# Patient Record
Sex: Female | Born: 1995
Health system: Southern US, Community
[De-identification: ages and names within clinical notes are randomized; demographics above are authoritative.]

## PROBLEM LIST (undated history)

## (undated) DIAGNOSIS — N159 Renal tubulo-interstitial disease, unspecified: Secondary | ICD-10-CM

## (undated) HISTORY — PX: TOOTH EXTRACTION: SUR596

---
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
## 2014-11-21 ENCOUNTER — Emergency Department (HOSPITAL_COMMUNITY)
Admission: EM | Admit: 2014-11-21 | Discharge: 2014-11-21 | Disposition: A | Discharge status: Home / Self Care | Payer: Federal, State, Local not specified - PPO | Attending: Emergency Medicine | Admitting: Emergency Medicine

## 2014-11-21 ENCOUNTER — Emergency Department (HOSPITAL_COMMUNITY): Payer: Federal, State, Local not specified - PPO

## 2014-11-21 ENCOUNTER — Encounter (HOSPITAL_COMMUNITY): Payer: Self-pay

## 2014-11-21 DIAGNOSIS — N12 Tubulo-interstitial nephritis, not specified as acute or chronic: Secondary | ICD-10-CM | POA: Insufficient documentation

## 2014-11-21 DIAGNOSIS — Z79899 Other long term (current) drug therapy: Secondary | ICD-10-CM | POA: Diagnosis not present

## 2014-11-21 DIAGNOSIS — R1031 Right lower quadrant pain: Secondary | ICD-10-CM | POA: Diagnosis present

## 2014-11-21 DIAGNOSIS — Z3202 Encounter for pregnancy test, result negative: Secondary | ICD-10-CM | POA: Diagnosis not present

## 2014-11-21 LAB — BASIC METABOLIC PANEL
ANION GAP: 6 (ref 5–15)
BUN: 12 mg/dL (ref 6–20)
CHLORIDE: 103 mmol/L (ref 101–111)
CO2: 23 mmol/L (ref 22–32)
CREATININE: 0.97 mg/dL (ref 0.44–1.00)
Calcium: 7.4 mg/dL — ABNORMAL LOW (ref 8.9–10.3)
GFR calc non Af Amer: >60 mL/min (ref >60)
Glucose, Bld: 136 mg/dL — ABNORMAL HIGH (ref 65–99)
Potassium: 3.3 mmol/L — ABNORMAL LOW (ref 3.5–5.1)
Sodium: 132 mmol/L — ABNORMAL LOW (ref 135–145)

## 2014-11-21 LAB — URINALYSIS, ROUTINE W REFLEX MICROSCOPIC
Bilirubin Urine: NEGATIVE
Glucose, UA: 100 mg/dL — AB
NITRITE: POSITIVE — AB
Protein, ur: 100 mg/dL — AB
Urobilinogen, UA: 1 mg/dL (ref 0.0–1.0)
pH: 5.5 (ref 5.0–8.0)

## 2014-11-21 LAB — CBC WITH DIFFERENTIAL/PLATELET
BASOS PCT: 0 %
Basophils Absolute: 0 10*3/uL (ref 0.0–0.1)
Eosinophils Absolute: 0 10*3/uL (ref 0.0–0.7)
Eosinophils Relative: 0 %
HEMATOCRIT: 34.7 % — AB (ref 36.0–46.0)
HEMOGLOBIN: 12.1 g/dL (ref 12.0–15.0)
LYMPHS ABS: 0.5 10*3/uL — AB (ref 0.7–4.0)
Lymphocytes Relative: 4 %
MCH: 31.8 pg (ref 26.0–34.0)
MCHC: 34.9 g/dL (ref 30.0–36.0)
MCV: 91.3 fL (ref 78.0–100.0)
Monocytes Absolute: 1 10*3/uL (ref 0.1–1.0)
Monocytes Relative: 7 %
NEUTROS PCT: 89 %
Neutro Abs: 12.4 10*3/uL — ABNORMAL HIGH (ref 1.7–7.7)
Platelets: 147 10*3/uL — ABNORMAL LOW (ref 150–400)
RBC: 3.8 MIL/uL — ABNORMAL LOW (ref 3.87–5.11)
RDW: 12.4 % (ref 11.5–15.5)
WBC: 14 10*3/uL — ABNORMAL HIGH (ref 4.0–10.5)

## 2014-11-21 LAB — URINE MICROSCOPIC-ADD ON

## 2014-11-21 LAB — POC URINE PREG, ED: Preg Test, Ur: NEGATIVE

## 2014-11-21 MED ORDER — SODIUM CHLORIDE 0.9 % IV BOLUS (SEPSIS)
1000.0000 mL | Freq: Once | INTRAVENOUS | Status: AC
  Administered 2014-11-21: 1000 mL via INTRAVENOUS

## 2014-11-21 MED ORDER — ONDANSETRON HCL 4 MG/2ML IJ SOLN
4.0000 mg | Freq: Once | INTRAMUSCULAR | Status: AC
  Administered 2014-11-21: 4 mg via INTRAVENOUS

## 2014-11-21 MED ORDER — IOHEXOL 300 MG/ML  SOLN
100.0000 mL | Freq: Once | INTRAMUSCULAR | Status: AC | PRN
  Administered 2014-11-21: 100 mL via INTRAVENOUS

## 2014-11-21 MED ORDER — HYDROCODONE-ACETAMINOPHEN 5-325 MG PO TABS
2.0000 | ORAL_TABLET | ORAL | Status: DC | PRN
Start: 2014-11-21 — End: 2015-09-02

## 2014-11-21 MED ORDER — MORPHINE SULFATE (PF) 2 MG/ML IV SOLN
2.0000 mg | Freq: Once | INTRAVENOUS | Status: AC
  Administered 2014-11-21: 2 mg via INTRAVENOUS
  Filled 2014-11-21: qty 1

## 2014-11-21 MED ORDER — MORPHINE SULFATE (PF) 4 MG/ML IV SOLN: 4.0000 mg | Freq: Once | INTRAVENOUS | Status: DC

## 2014-11-21 MED ORDER — ONDANSETRON HCL 4 MG/2ML IJ SOLN
INTRAMUSCULAR | Status: AC
  Filled 2014-11-21: qty 2

## 2014-11-21 MED ORDER — ACETAMINOPHEN 500 MG PO TABS
1000.0000 mg | ORAL_TABLET | Freq: Once | ORAL | Status: AC
  Administered 2014-11-21: 1000 mg via ORAL
  Filled 2014-11-21: qty 2

## 2014-11-21 MED ORDER — FENTANYL CITRATE (PF) 100 MCG/2ML IJ SOLN
INTRAMUSCULAR | Status: AC
  Administered 2014-11-21: 100 ug via INTRAVENOUS
  Filled 2014-11-21: qty 2

## 2014-11-21 MED ORDER — ACETAMINOPHEN 500 MG PO TABS: 1000.0000 mg | ORAL_TABLET | Freq: Once | ORAL | Status: DC

## 2014-11-21 MED ORDER — SODIUM CHLORIDE 0.9 % IJ SOLN
INTRAMUSCULAR | Status: AC
  Filled 2014-11-21: qty 1000

## 2014-11-21 MED ORDER — FENTANYL CITRATE (PF) 100 MCG/2ML IJ SOLN
100.0000 ug | Freq: Once | INTRAMUSCULAR | Status: AC
  Administered 2014-11-21: 100 ug via INTRAVENOUS

## 2014-11-21 MED ORDER — NAPROXEN 500 MG PO TABS: 500.0000 mg | ORAL_TABLET | Freq: Two times a day (BID) | ORAL | Status: DC

## 2014-11-21 MED ORDER — CEPHALEXIN 500 MG PO CAPS: 500.0000 mg | ORAL_CAPSULE | Freq: Four times a day (QID) | ORAL | Status: DC

## 2014-11-21 MED ORDER — SODIUM CHLORIDE 0.9 % IJ SOLN
INTRAMUSCULAR | Status: AC
  Filled 2014-11-21: qty 30

## 2014-11-21 MED ORDER — DEXTROSE 5 % IV SOLN
1.0000 g | Freq: Once | INTRAVENOUS | Status: AC
  Administered 2014-11-21: 1 g via INTRAVENOUS
  Filled 2014-11-21: qty 10

## 2014-11-21 MED ORDER — ONDANSETRON HCL 4 MG PO TABS: 4.0000 mg | ORAL_TABLET | Freq: Three times a day (TID) | ORAL | Status: DC | PRN
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
## 2014-11-21 NOTE — Discharge Instructions (Signed)

## 2014-11-21 NOTE — ED Notes (Signed)
Lab at bedside to redraw bmet,

## 2014-11-21 NOTE — ED Notes (Signed)
Pt requesting additional pain medication while playing on her phone, mother at bedside, pt also c/o pain at iv site, states that she hit the site while she was in the restroom, iv site wnl, able to be flushed with no problems, blood return noted,

## 2014-11-21 NOTE — ED Notes (Signed)
Pt c/o RLQ abd pain since Monday.  Reports vomiting since yesterday.  Pt saw pcp yesterday and was given an antibiotic for a UTI.  LBM was yesterday and was normal.

## 2014-11-21 NOTE — ED Notes (Signed)
Dr. Hyacinth MeekerMiller notified of 100.9 temp

## 2014-11-21 NOTE — ED Notes (Signed)
Update given to family at bedside,

## 2014-11-21 NOTE — ED Notes (Signed)
Pt would not tolerate bp cuff in triage.

## 2014-11-22 ENCOUNTER — Encounter: Payer: Self-pay | Admitting: Adult Health

## 2014-11-23 LAB — URINE CULTURE: Culture: NO GROWTH

## 2015-04-04 ENCOUNTER — Encounter (HOSPITAL_COMMUNITY): Payer: Self-pay | Admitting: Emergency Medicine

## 2015-04-04 ENCOUNTER — Emergency Department (HOSPITAL_COMMUNITY)
Admission: EM | Admit: 2015-04-04 | Discharge: 2015-04-04 | Disposition: A | Discharge status: Home / Self Care | Payer: Federal, State, Local not specified - PPO | Attending: Emergency Medicine | Admitting: Emergency Medicine

## 2015-04-04 DIAGNOSIS — Z792 Long term (current) use of antibiotics: Secondary | ICD-10-CM | POA: Insufficient documentation

## 2015-04-04 DIAGNOSIS — R42 Dizziness and giddiness: Secondary | ICD-10-CM | POA: Diagnosis not present

## 2015-04-04 DIAGNOSIS — R51 Headache: Secondary | ICD-10-CM | POA: Insufficient documentation

## 2015-04-04 DIAGNOSIS — Z791 Long term (current) use of non-steroidal anti-inflammatories (NSAID): Secondary | ICD-10-CM | POA: Diagnosis not present

## 2015-04-04 DIAGNOSIS — Z79899 Other long term (current) drug therapy: Secondary | ICD-10-CM | POA: Diagnosis not present

## 2015-04-04 DIAGNOSIS — N12 Tubulo-interstitial nephritis, not specified as acute or chronic: Secondary | ICD-10-CM

## 2015-04-04 DIAGNOSIS — R1030 Lower abdominal pain, unspecified: Secondary | ICD-10-CM | POA: Diagnosis present

## 2015-04-04 HISTORY — DX: Renal tubulo-interstitial disease, unspecified: N15.9

## 2015-04-04 LAB — BASIC METABOLIC PANEL
Anion gap: 9 (ref 5–15)
BUN: 10 mg/dL (ref 6–20)
CO2: 25 mmol/L (ref 22–32)
Calcium: 9.2 mg/dL (ref 8.9–10.3)
Chloride: 104 mmol/L (ref 101–111)
Creatinine, Ser: 0.86 mg/dL (ref 0.44–1.00)
GFR calc Af Amer: >60 mL/min (ref >60)
Glucose, Bld: 93 mg/dL (ref 65–99)
Potassium: 3.6 mmol/L (ref 3.5–5.1)
SODIUM: 138 mmol/L (ref 135–145)

## 2015-04-04 LAB — URINALYSIS, ROUTINE W REFLEX MICROSCOPIC
Bilirubin Urine: NEGATIVE
Glucose, UA: NEGATIVE mg/dL
KETONES UR: NEGATIVE mg/dL
NITRITE: POSITIVE — AB
PH: 7.5 (ref 5.0–8.0)
Protein, ur: 30 mg/dL — AB
Specific Gravity, Urine: 1.01 (ref 1.005–1.030)

## 2015-04-04 LAB — CBC WITH DIFFERENTIAL/PLATELET
Basophils Absolute: 0 10*3/uL (ref 0.0–0.1)
Basophils Relative: 0 %
EOS ABS: 0 10*3/uL (ref 0.0–0.7)
EOS PCT: 0 %
HCT: 36.5 % (ref 36.0–46.0)
Hemoglobin: 12.7 g/dL (ref 12.0–15.0)
LYMPHS ABS: 1.1 10*3/uL (ref 0.7–4.0)
Lymphocytes Relative: 9 %
MCH: 31.4 pg (ref 26.0–34.0)
MCHC: 34.8 g/dL (ref 30.0–36.0)
MCV: 90.1 fL (ref 78.0–100.0)
MONO ABS: 1.1 10*3/uL — AB (ref 0.1–1.0)
MONOS PCT: 9 %
Neutro Abs: 9.5 10*3/uL — ABNORMAL HIGH (ref 1.7–7.7)
Neutrophils Relative %: 82 %
PLATELETS: 206 10*3/uL (ref 150–400)
RBC: 4.05 MIL/uL (ref 3.87–5.11)
RDW: 12.1 % (ref 11.5–15.5)
WBC: 11.7 10*3/uL — AB (ref 4.0–10.5)

## 2015-04-04 LAB — URINE MICROSCOPIC-ADD ON

## 2015-04-04 LAB — PREGNANCY, URINE: Preg Test, Ur: NEGATIVE

## 2015-04-04 MED ORDER — ONDANSETRON HCL 4 MG/2ML IJ SOLN
4.0000 mg | Freq: Once | INTRAMUSCULAR | Status: AC
  Administered 2015-04-04: 4 mg via INTRAVENOUS
  Filled 2015-04-04: qty 2

## 2015-04-04 MED ORDER — CIPROFLOXACIN IN D5W 400 MG/200ML IV SOLN
400.0000 mg | Freq: Once | INTRAVENOUS | Status: AC
  Administered 2015-04-04: 400 mg via INTRAVENOUS
  Filled 2015-04-04: qty 200

## 2015-04-04 MED ORDER — SODIUM CHLORIDE 0.9 % IV BOLUS (SEPSIS)
250.0000 mL | Freq: Once | INTRAVENOUS | Status: AC
  Administered 2015-04-04: 250 mL via INTRAVENOUS

## 2015-04-04 MED ORDER — CIPROFLOXACIN HCL 500 MG PO TABS: 500.0000 mg | ORAL_TABLET | Freq: Two times a day (BID) | ORAL | Status: DC

## 2015-04-04 MED ORDER — SODIUM CHLORIDE 0.9 % IV BOLUS (SEPSIS)
750.0000 mL | Freq: Once | INTRAVENOUS | Status: AC
  Administered 2015-04-04: 750 mL via INTRAVENOUS

## 2015-04-04 MED ORDER — HYDROCODONE-ACETAMINOPHEN 5-325 MG PO TABS: 1.0000 | ORAL_TABLET | Freq: Four times a day (QID) | ORAL | Status: DC | PRN

## 2015-04-04 MED ORDER — PHENAZOPYRIDINE HCL 100 MG PO TABS
ORAL_TABLET | ORAL | Status: AC
  Filled 2015-04-04: qty 2

## 2015-04-04 MED ORDER — PHENAZOPYRIDINE HCL 100 MG PO TABS
200.0000 mg | ORAL_TABLET | Freq: Once | ORAL | Status: AC
  Administered 2015-04-04: 200 mg via ORAL

## 2015-04-04 MED ORDER — ONDANSETRON 4 MG PO TBDP: 4.0000 mg | ORAL_TABLET | Freq: Three times a day (TID) | ORAL | Status: DC | PRN

## 2015-04-04 MED ORDER — HYDROMORPHONE HCL 1 MG/ML IJ SOLN
1.0000 mg | Freq: Once | INTRAMUSCULAR | Status: AC
  Administered 2015-04-04: 1 mg via INTRAVENOUS
  Filled 2015-04-04: qty 1

## 2015-04-04 MED ORDER — SODIUM CHLORIDE 0.9 % IV SOLN
INTRAVENOUS | Status: DC
  Administered 2015-04-04: 12:00:00 via INTRAVENOUS

## 2015-04-04 NOTE — ED Provider Notes (Addendum)
CSN: [id number]     Arrival date & time 04/04/15  1003 History  By signing my name below, I, Tanda Rockers, attest that this documentation has been prepared under the direction and in the presence of Vanetta Mulders, MD. Electronically Signed: Tanda Rockers, ED Scribe. 04/04/2015. 11:43 AM.   Chief Complaint  Patient presents with  . Abdominal Pain   The history is provided by the patient. No language interpreter was used.     HPI Comments: Leslie Simon is a 20 y.o. female who presents to the Emergency Department complaining of constant foul odor to urine x 2 weeks. Pt also complains of suprapubic abdominal pain radiating to back, nausea, and vomiting. She mentions having a fever a few days ago that has since resolved on its own. Pt had a UTI in November 2016 but never finished the antibiotics. She had left over Keflex and began taking it about 1 week ago with no relief. Denies any other associated symptoms.   Past Medical History  Diagnosis Date  . Kidney infection    Past Surgical History  Procedure Laterality Date  . Tooth extraction     Family History  Problem Relation Age of Onset  . Heart disease    . Cancer    . Diabetes     Social History  Substance Use Topics  . Smoking status: Never Smoker   . Smokeless tobacco: None  . Alcohol Use: No   OB History    No data available     Review of Systems  Constitutional: Positive for fever and chills.  HENT: Negative for congestion, rhinorrhea and sore throat.   Eyes: Negative for visual disturbance.  Respiratory: Negative for cough and shortness of breath.   Cardiovascular: Negative for chest pain and leg swelling.  Gastrointestinal: Positive for nausea, vomiting and abdominal pain. Negative for diarrhea.  Genitourinary: Negative for dysuria, frequency and hematuria.       + Foul smelling urine  Musculoskeletal: Positive for back pain. Negative for neck pain.  Skin: Negative for rash.  Neurological: Positive for  dizziness, light-headedness and headaches.  Hematological: Does not bruise/bleed easily.  Psychiatric/Behavioral: Negative for confusion.      Allergies  Review of patient's allergies indicates no known allergies.  Home Medications   Prior to Admission medications   Medication Sig Start Date End Date Taking? Authorizing Provider  cephALEXin (KEFLEX) 500 MG capsule Take 1 capsule (500 mg total) by mouth 4 (four) times daily. Patient taking differently: Take 500 mg by mouth 2 (two) times daily.  11/21/14  Yes Eber Hong, MD  ibuprofen (ADVIL,MOTRIN) 200 MG tablet Take 600 mg by mouth daily as needed for moderate pain.   Yes Historical Provider, MD  ondansetron (ZOFRAN) 4 MG tablet Take 1 tablet (4 mg total) by mouth every 8 (eight) hours as needed for nausea or vomiting. 11/21/14  Yes Eber Hong, MD  ciprofloxacin (CIPRO) 500 MG tablet Take 1 tablet (500 mg total) by mouth 2 (two) times daily. 04/04/15   Vanetta Mulders, MD  HYDROcodone-acetaminophen (NORCO/VICODIN) 5-325 MG tablet Take 2 tablets by mouth every 4 (four) hours as needed. Patient not taking: Reported on 04/04/2015 11/21/14   Eber Hong, MD  HYDROcodone-acetaminophen (NORCO/VICODIN) 5-325 MG tablet Take 1-2 tablets by mouth every 6 (six) hours as needed. 04/04/15   Vanetta Mulders, MD  ondansetron (ZOFRAN ODT) 4 MG disintegrating tablet Take 1 tablet (4 mg total) by mouth every 8 (eight) hours as needed. 04/04/15   Vanetta Mulders, MD  BP 104/63 mmHg  Pulse 71  Temp(Src) 98.5 F (36.9 C) (Oral)  Resp 18  Ht 5\' 8"  (1.727 m)  Wt 61.236 kg  BMI 20.53 kg/m2  SpO2 97%  LMP 03/18/2015   Physical Exam  Constitutional: She is oriented to person, place, and time. She appears well-developed and well-nourished. No distress.  HENT:  Head: Normocephalic and atraumatic.  Mouth/Throat: Mucous membranes are normal.  Eyes: Conjunctivae and EOM are normal. Pupils are equal, round, and reactive to light. Right eye exhibits no  discharge. Left eye exhibits no discharge.  Neck: Neck supple. No tracheal deviation present.  Cardiovascular: Normal rate, regular rhythm and normal heart sounds.   Pulmonary/Chest: Effort normal and breath sounds normal. No respiratory distress. She has no wheezes. She has no rhonchi. She has no rales.  Abdominal: There is tenderness in the suprapubic area.  Musculoskeletal: Normal range of motion. She exhibits no edema.  Neurological: She is alert and oriented to person, place, and time.  Skin: Skin is warm and dry.  Psychiatric: She has a normal mood and affect. Her behavior is normal.  Nursing note and vitals reviewed.   ED Course  Procedures (including critical care time)  DIAGNOSTIC STUDIES: Oxygen Saturation is 94% on RA, adequate by my interpretation.    COORDINATION OF CARE: 11:28 AM-Discussed treatment plan with pt at bedside and pt agreed to plan.   Labs Review Labs Reviewed  URINALYSIS, ROUTINE W REFLEX MICROSCOPIC (NOT AT [facility]) - Abnormal; Notable for the following:    APPearance HAZY (*)    Hgb urine dipstick TRACE (*)    Protein, ur 30 (*)    Nitrite POSITIVE (*)    Leukocytes, UA SMALL (*)    All other components within normal limits  URINE MICROSCOPIC-ADD ON - Abnormal; Notable for the following:    Squamous Epithelial / LPF TOO NUMEROUS TO COUNT (*)    Bacteria, UA MANY (*)    All other components within normal limits  CBC WITH DIFFERENTIAL/PLATELET - Abnormal; Notable for the following:    WBC 11.7 (*)    Neutro Abs 9.5 (*)    Monocytes Absolute 1.1 (*)    All other components within normal limits  URINE CULTURE  PREGNANCY, URINE  BASIC METABOLIC PANEL   Results for orders placed or performed during the hospital encounter of 04/04/15  Urinalysis, Routine w reflex microscopic (not at [facility])  Result Value Ref Range   Color, Urine YELLOW YELLOW   APPearance HAZY (A) CLEAR   Specific Gravity, Urine 1.010 1.005 - 1.030   pH 7.5 5.0 - 8.0   Glucose, UA  NEGATIVE NEGATIVE mg/dL   Hgb urine dipstick TRACE (A) NEGATIVE   Bilirubin Urine NEGATIVE NEGATIVE   Ketones, ur NEGATIVE NEGATIVE mg/dL   Protein, ur 30 (A) NEGATIVE mg/dL   Nitrite POSITIVE (A) NEGATIVE   Leukocytes, UA SMALL (A) NEGATIVE  Pregnancy, urine  Result Value Ref Range   Preg Test, Ur NEGATIVE NEGATIVE  Urine microscopic-add on  Result Value Ref Range   Squamous Epithelial / LPF TOO NUMEROUS TO COUNT (A) NONE SEEN   WBC, UA TOO NUMEROUS TO COUNT 0 - 5 WBC/hpf   RBC / HPF 0-5 0 - 5 RBC/hpf   Bacteria, UA MANY (A) NONE SEEN  CBC with Differential/Platelet  Result Value Ref Range   WBC 11.7 (H) 4.0 - 10.5 K/uL   RBC 4.05 3.87 - 5.11 MIL/uL   Hemoglobin 12.7 12.0 - 15.0 g/dL   HCT 16.136.5 09.636.0 -  46.0 %   MCV 90.1 78.0 - 100.0 fL   MCH 31.4 26.0 - 34.0 pg   MCHC 34.8 30.0 - 36.0 g/dL   RDW 16.1 09.6 - 04.5 %   Platelets 206 150 - 400 K/uL   Neutrophils Relative % 82 %   Neutro Abs 9.5 (H) 1.7 - 7.7 K/uL   Lymphocytes Relative 9 %   Lymphs Abs 1.1 0.7 - 4.0 K/uL   Monocytes Relative 9 %   Monocytes Absolute 1.1 (H) 0.1 - 1.0 K/uL   Eosinophils Relative 0 %   Eosinophils Absolute 0.0 0.0 - 0.7 K/uL   Basophils Relative 0 %   Basophils Absolute 0.0 0.0 - 0.1 K/uL  Basic metabolic panel  Result Value Ref Range   Sodium 138 135 - 145 mmol/L   Potassium 3.6 3.5 - 5.1 mmol/L   Chloride 104 101 - 111 mmol/L   CO2 25 22 - 32 mmol/L   Glucose, Bld 93 65 - 99 mg/dL   BUN 10 6 - 20 mg/dL   Creatinine, Ser 4.09 0.44 - 1.00 mg/dL   Calcium 9.2 8.9 - 81.1 mg/dL   GFR calc non Af Amer >60 >60 mL/min   GFR calc Af Amer >60 >60 mL/min   Anion gap 9 5 - 15     Imaging Review No results found. I have personally reviewed and evaluated these lab results as part of my medical decision-making.   EKG Interpretation None      MDM   Final diagnoses:  Pyelonephritis   Patient with 2 week history of urinary tract symptoms. Took Keflex that she had left over for one week  without any improvement. Urinalysis totally consistent with urinary tract infection. Culture sent for confirmation. Patient's symptomatology consistent with pyelonephritis. Some nausea some vomiting and she'll feeling pain in the lower part of the back. Will treat as pyelonephritis. Patient given one dose of IV Cipro here and be continued on Cipro for the next 10 days. Patient instructed if not improved in 2 days will return. Patient without any concerns for pelvic infection no vaginal discharge. See test negative.  I personally performed the services described in this documentation, which was scribed in my presence. The recorded information has been reviewed and is accurate.        Vanetta Mulders, MD 04/04/15 1304  Vanetta Mulders, MD 04/04/15 [phone]

## 2015-04-04 NOTE — Discharge Instructions (Signed)
Pyelonephritis, Adult Pyelonephritis is a kidney infection. The kidneys are organs that help clean your blood by moving waste out of your blood and into your pee (urine). This infection can happen quickly, or it can last for a long time. In most cases, it clears up with treatment and does not cause other problems. HOME CARE Medicines  Take over-the-counter and prescription medicines only as told by your doctor.  Take your antibiotic medicine as told by your doctor. Do not stop taking the medicine even if you start to feel better. General Instructions  Drink enough fluid to keep your pee clear or pale yellow.  Avoid caffeine, tea, and carbonated drinks.  Pee (urinate) often. Avoid holding in pee for long periods of time.  Pee before and after sex.  After pooping (having a bowel movement), women should wipe from front to back. Use each tissue only once.  Keep all follow-up visits as told by your doctor. This is important. GET HELP IF:  You do not feel better after 2 days.  Your symptoms get worse.  You have a fever. GET HELP RIGHT AWAY IF:  You cannot take your medicine or drink fluids as told.  You have chills and shaking.  You throw up (vomit).  You have very bad pain in your side (flank) or back.  You feel very weak or you pass out (faint).   This information is not intended to replace advice given to you by your health care provider. Make sure you discuss any questions you have with your health care provider.  Take antibiotic as directed. Take pain medicine antinausea medicine as needed. Make appointment to follow-up with your doctor if not improving in 2 days. Return for any new or worse symptoms. Expected to improve in 2 days.   Document Released: 02/06/2004 Document Revised: 09/19/2014 Document Reviewed: 04/23/2014 Elsevier Interactive Patient Education [employer]

## 2015-04-04 NOTE — ED Notes (Signed)
Pt reports lower abd pain radiating to back. Pt reports odor to urine,urinary frequency. Pt reports intermittent nausea and fever. Pt reports taking otc medicine with no relief. nad noted.

## 2015-04-06 ENCOUNTER — Encounter (HOSPITAL_COMMUNITY): Payer: Self-pay

## 2015-04-06 ENCOUNTER — Emergency Department (HOSPITAL_COMMUNITY)
Admission: EM | Admit: 2015-04-06 | Discharge: 2015-04-06 | Disposition: A | Discharge status: Home / Self Care | Payer: Federal, State, Local not specified - PPO | Attending: Emergency Medicine | Admitting: Emergency Medicine

## 2015-04-06 ENCOUNTER — Emergency Department (HOSPITAL_COMMUNITY): Payer: Federal, State, Local not specified - PPO

## 2015-04-06 DIAGNOSIS — N12 Tubulo-interstitial nephritis, not specified as acute or chronic: Secondary | ICD-10-CM | POA: Diagnosis not present

## 2015-04-06 DIAGNOSIS — R109 Unspecified abdominal pain: Secondary | ICD-10-CM

## 2015-04-06 DIAGNOSIS — Z792 Long term (current) use of antibiotics: Secondary | ICD-10-CM | POA: Insufficient documentation

## 2015-04-06 DIAGNOSIS — Z791 Long term (current) use of non-steroidal anti-inflammatories (NSAID): Secondary | ICD-10-CM | POA: Diagnosis not present

## 2015-04-06 LAB — URINALYSIS, ROUTINE W REFLEX MICROSCOPIC
BILIRUBIN URINE: NEGATIVE
GLUCOSE, UA: NEGATIVE mg/dL
HGB URINE DIPSTICK: NEGATIVE
Ketones, ur: NEGATIVE mg/dL
Leukocytes, UA: NEGATIVE
Nitrite: NEGATIVE
Protein, ur: NEGATIVE mg/dL
SPECIFIC GRAVITY, URINE: 1.02 (ref 1.005–1.030)
pH: 6 (ref 5.0–8.0)

## 2015-04-06 LAB — URINE CULTURE: Culture: >=100000

## 2015-04-06 LAB — POC URINE PREG, ED: Preg Test, Ur: NEGATIVE

## 2015-04-06 MED ORDER — IBUPROFEN 800 MG PO TABS
800.0000 mg | ORAL_TABLET | Freq: Once | ORAL | Status: AC
  Administered 2015-04-06: 800 mg via ORAL

## 2015-04-06 MED ORDER — IBUPROFEN 800 MG PO TABS
ORAL_TABLET | ORAL | Status: AC
  Filled 2015-04-06: qty 1

## 2015-04-06 NOTE — ED Notes (Signed)
Pt given Ibuprofen 800mg  with crackers and peanut better

## 2015-04-06 NOTE — Discharge Instructions (Signed)

## 2015-04-06 NOTE — ED Notes (Signed)
Pt reports here two days ago with UTI and mild right flank pain. Prescribed antibiotics which she is taking, states all urinary symptoms have resolved but right flank pain now worse.

## 2015-04-07 ENCOUNTER — Telehealth: Payer: Self-pay | Admitting: *Deleted

## 2015-04-07 NOTE — ED Notes (Signed)
(+)  urine culture, reviewed by Ronnell FreshwaterS. Joy, PA, treated with Cipro, no changes needed

## 2015-04-08 NOTE — ED Provider Notes (Signed)
CSN: [id number]     Arrival date & time 04/06/15  1107 History   First MD Initiated Contact with Patient 04/06/15 1140     Chief Complaint  Patient presents with  . Flank Pain     (Consider location/radiation/quality/duration/timing/severity/associated sxs/prior Treatment) The history is provided by the patient.   Leslie Simon is a 20 y.o. female with a frequent history of uti's and pyelonephritis was seen here 2 days ago and diagnosed with a uti/probable right pyelonephritis and was started on ciprofloxacin which she is taking, last dose this am.  She endorses that her urinary symptoms including frequency along with foul smelling, dark urine and suprapubic tenderness has improved but now she has worsened right flank pain along with her persistent nausea, but has had no vomiting and also denies fevers or chills.  She denies personal or family history of kidney stones.  She was told to return for any worsened symptoms.      Past Medical History  Diagnosis Date  . Kidney infection    Past Surgical History  Procedure Laterality Date  . Tooth extraction     Family History  Problem Relation Age of Onset  . Heart disease    . Cancer    . Diabetes     Social History  Substance Use Topics  . Smoking status: Never Smoker   . Smokeless tobacco: None  . Alcohol Use: No   OB History    No data available     Review of Systems  Constitutional: Negative for fever.  HENT: Negative for congestion and sore throat.   Eyes: Negative.   Respiratory: Negative for chest tightness and shortness of breath.   Cardiovascular: Negative for chest pain.  Gastrointestinal: Negative for nausea, vomiting and abdominal pain.  Genitourinary: Positive for frequency. Negative for hematuria, vaginal discharge, vaginal pain and pelvic pain.       Negative except as mentioned in HPI.    Musculoskeletal: Negative for joint swelling, arthralgias and neck pain.  Skin: Negative.  Negative for rash and  wound.  Neurological: Negative for dizziness, weakness, light-headedness, numbness and headaches.  Psychiatric/Behavioral: Negative.       Allergies  Review of patient's allergies indicates no known allergies.  Home Medications   Prior to Admission medications   Medication Sig Start Date End Date Taking? Authorizing Provider  ciprofloxacin (CIPRO) 500 MG tablet Take 1 tablet (500 mg total) by mouth 2 (two) times daily. 04/04/15  Yes Vanetta Mulders, MD  HYDROcodone-acetaminophen (NORCO/VICODIN) 5-325 MG tablet Take 1-2 tablets by mouth every 6 (six) hours as needed. 04/04/15  Yes Vanetta Mulders, MD  ibuprofen (ADVIL,MOTRIN) 200 MG tablet Take 600 mg by mouth daily as needed for moderate pain.   Yes Historical Provider, MD  ondansetron (ZOFRAN ODT) 4 MG disintegrating tablet Take 1 tablet (4 mg total) by mouth every 8 (eight) hours as needed. Patient taking differently: Take 4 mg by mouth every 8 (eight) hours as needed for nausea or vomiting.  04/04/15  Yes Vanetta Mulders, MD  cephALEXin (KEFLEX) 500 MG capsule Take 1 capsule (500 mg total) by mouth 4 (four) times daily. Patient not taking: Reported on 04/06/2015 11/21/14   Eber Hong, MD  HYDROcodone-acetaminophen (NORCO/VICODIN) 5-325 MG tablet Take 2 tablets by mouth every 4 (four) hours as needed. Patient not taking: Reported on 04/04/2015 11/21/14   Eber Hong, MD  ondansetron (ZOFRAN) 4 MG tablet Take 1 tablet (4 mg total) by mouth every 8 (eight) hours as needed for nausea or  vomiting. Patient not taking: Reported on 04/06/2015 11/21/14   Eber HongBrian Miller, MD   BP 102/60 mmHg  Pulse 75  Temp(Src) 97.9 F (36.6 C) (Oral)  Resp 16  Ht 5\' 8"  (1.727 m)  Wt 61.236 kg  BMI 20.53 kg/m2  SpO2 100%  LMP 03/18/2015 Physical Exam  Constitutional: She appears well-developed and well-nourished.  HENT:  Head: Normocephalic and atraumatic.  Eyes: Conjunctivae are normal.  Neck: Normal range of motion.  Cardiovascular: Normal rate, regular  rhythm, normal heart sounds and intact distal pulses.   Pulmonary/Chest: Effort normal and breath sounds normal. She has no wheezes.  Abdominal: Soft. Bowel sounds are normal. There is no tenderness. There is CVA tenderness. There is no rebound and no guarding.  Right cva ttp.  Musculoskeletal: Normal range of motion.  Neurological: She is alert.  Skin: Skin is warm and dry.  Psychiatric: She has a normal mood and affect.  Nursing note and vitals reviewed.   ED Course  Procedures (including critical care time) Labs Review Labs Reviewed  URINALYSIS, ROUTINE W REFLEX MICROSCOPIC (NOT AT [facility])  POC URINE PREG, ED    Imaging Review No results found. I have personally reviewed and evaluated these images and lab results as part of my medical decision-making.   EKG Interpretation None      MDM   Final diagnoses:  Flank pain  Pyelonephritis    Labs reviewed, urine improved from last.  Culture reviewed, infection sensitive to cipro.  CT reviewed, mild pyelo, no renal stone, no sign of abscess.  Pt encouraged to continue current regimen, finish entire course of abx, increased fluid intake, f/u with pcp or return here for any worsened sx .    Burgess AmorJulie Giabella Duhart, PA-C 04/08/15 1647  Pricilla LovelessScott Goldston, MD 04/11/15 1447

## 2015-09-02 ENCOUNTER — Encounter (HOSPITAL_COMMUNITY): Payer: Self-pay | Admitting: Emergency Medicine

## 2015-09-02 ENCOUNTER — Emergency Department (HOSPITAL_COMMUNITY)
Admission: EM | Admit: 2015-09-02 | Discharge: 2015-09-02 | Disposition: A | Discharge status: Home / Self Care | Payer: Federal, State, Local not specified - PPO | Attending: Emergency Medicine | Admitting: Emergency Medicine

## 2015-09-02 DIAGNOSIS — N760 Acute vaginitis: Secondary | ICD-10-CM | POA: Diagnosis not present

## 2015-09-02 DIAGNOSIS — R1031 Right lower quadrant pain: Secondary | ICD-10-CM | POA: Insufficient documentation

## 2015-09-02 DIAGNOSIS — B9689 Other specified bacterial agents as the cause of diseases classified elsewhere: Secondary | ICD-10-CM

## 2015-09-02 DIAGNOSIS — Z79899 Other long term (current) drug therapy: Secondary | ICD-10-CM | POA: Diagnosis not present

## 2015-09-02 LAB — URINE MICROSCOPIC-ADD ON

## 2015-09-02 LAB — CBC
HCT: 36.8 % (ref 36.0–46.0)
Hemoglobin: 12.4 g/dL (ref 12.0–15.0)
MCH: 30.3 pg (ref 26.0–34.0)
MCHC: 33.7 g/dL (ref 30.0–36.0)
MCV: 90 fL (ref 78.0–100.0)
Platelets: 168 10*3/uL (ref 150–400)
RBC: 4.09 MIL/uL (ref 3.87–5.11)
RDW: 12.4 % (ref 11.5–15.5)
WBC: 10.5 10*3/uL (ref 4.0–10.5)

## 2015-09-02 LAB — BASIC METABOLIC PANEL
Anion gap: 9 (ref 5–15)
BUN: 12 mg/dL (ref 6–20)
CO2: 24 mmol/L (ref 22–32)
Calcium: 8.8 mg/dL — ABNORMAL LOW (ref 8.9–10.3)
Chloride: 103 mmol/L (ref 101–111)
Creatinine, Ser: 0.72 mg/dL (ref 0.44–1.00)
GFR calc Af Amer: >60 mL/min (ref >60)
GFR calc non Af Amer: >60 mL/min (ref >60)
Glucose, Bld: 80 mg/dL (ref 65–99)
Potassium: 3.9 mmol/L (ref 3.5–5.1)
Sodium: 136 mmol/L (ref 135–145)

## 2015-09-02 LAB — URINALYSIS, ROUTINE W REFLEX MICROSCOPIC
Bilirubin Urine: NEGATIVE
Glucose, UA: NEGATIVE mg/dL
Hgb urine dipstick: NEGATIVE
Ketones, ur: NEGATIVE mg/dL
Nitrite: NEGATIVE
Protein, ur: NEGATIVE mg/dL
Specific Gravity, Urine: 1.025 (ref 1.005–1.030)
pH: 6 (ref 5.0–8.0)

## 2015-09-02 LAB — WET PREP, GENITAL
Sperm: NONE SEEN
Trich, Wet Prep: NONE SEEN
Yeast Wet Prep HPF POC: NONE SEEN

## 2015-09-02 LAB — PREGNANCY, URINE: Preg Test, Ur: NEGATIVE

## 2015-09-02 MED ORDER — IBUPROFEN 400 MG PO TABS
ORAL_TABLET | ORAL | Status: AC
  Filled 2015-09-02: qty 1

## 2015-09-02 MED ORDER — OXYCODONE HCL 5 MG PO TABS
ORAL_TABLET | ORAL | Status: AC
  Filled 2015-09-02: qty 1

## 2015-09-02 MED ORDER — OXYCODONE-ACETAMINOPHEN 5-325 MG PO TABS
1.0000 | ORAL_TABLET | Freq: Once | ORAL | Status: DC
  Filled 2015-09-02: qty 1

## 2015-09-02 MED ORDER — IBUPROFEN 400 MG PO TABS
400.0000 mg | ORAL_TABLET | Freq: Once | ORAL | Status: AC
  Administered 2015-09-02: 400 mg via ORAL
  Filled 2015-09-02: qty 1

## 2015-09-02 MED ORDER — CEPHALEXIN 500 MG PO CAPS: 500.0000 mg | ORAL_CAPSULE | Freq: Three times a day (TID) | ORAL | 0 refills | Status: DC

## 2015-09-02 MED ORDER — METRONIDAZOLE 500 MG PO TABS
2000.0000 mg | ORAL_TABLET | Freq: Once | ORAL | Status: AC
  Administered 2015-09-02: 2000 mg via ORAL
  Filled 2015-09-02: qty 4

## 2015-09-02 NOTE — ED Triage Notes (Addendum)
Patient complaining of right flank pain x 2 days. Also complaining of urinary frequency.

## 2015-09-02 NOTE — ED Provider Notes (Signed)
[facility]-EMER[facility]ENCY DEPT Provider Note   CSN: [id number] Arrival date & time: 09/02/15  1454     History   Chief Complaint Chief Complaint  Patient presents with  . Flank Pain    HPI Leslie Simon is a 20 y.o. female.  HPI   20yF with R abdominal/R flank pain. Onset yesterday. Constant/progressive since then. No appreciable exacerbating or relieving factors. Feels achy. Reminds her of prior kidney infections. Increased urinary frequency. No urinary complaints otherwise. Some vaginal discharge. No bleeding. LMP about 3w ago. Doesn't think she is pregnant. Is sexually active. Subjective fever. Body aches. No n/v.   Past Medical History:  Diagnosis Date  . Kidney infection     Patient Active Problem List   Diagnosis Date Noted  . Stress fracture of foot 12/13/2011  . Pain in toe of left foot 10/26/2011  . Toe infection 10/26/2011  . Synovitis of toe 10/26/2011  . Synovitis of foot 10/26/2011    Past Surgical History:  Procedure Laterality Date  . TOOTH EXTRACTION      OB History    No data available       Home Medications    Prior to Admission medications   Medication Sig Start Date End Date Taking? Authorizing Provider  HYDROcodone-acetaminophen (NORCO/VICODIN) 5-325 MG tablet Take 1-2 tablets by mouth every 6 (six) hours as needed. Patient taking differently: Take 1 tablet by mouth once.  04/04/15  Yes Vanetta MuldersScott Zackowski, MD    Family History Family History  Problem Relation Age of Onset  . Heart disease    . Cancer    . Diabetes      Social History Social History  Substance Use Topics  . Smoking status: Never Smoker  . Smokeless tobacco: Never Used  . Alcohol use Yes     Comment: "sometimes"     Allergies   Review of patient's allergies indicates no known allergies.   Review of Systems Review of Systems  All systems reviewed and negative, other than as noted in HPI.   Physical Exam Updated Vital Signs BP 102/77 (BP Location: Left Arm)    Pulse 100   Temp 99.5 F (37.5 C) (Oral)   Resp 16   Ht 5\' 7"  (1.702 m)   Wt 145 lb (65.8 kg)   LMP 08/14/2015   SpO2 99%   BMI 22.71 kg/m   Physical Exam  Constitutional: She appears well-developed and well-nourished. No distress.  HENT:  Head: Normocephalic and atraumatic.  Eyes: Conjunctivae are normal.  Neck: Neck supple.  Cardiovascular: Normal rate and regular rhythm.   No murmur heard. Pulmonary/Chest: Effort normal and breath sounds normal. No respiratory distress.  Abdominal: Soft. There is tenderness.  R:Q tenderness without rebound or guarding. Abdomen is soft and NT.   Genitourinary:  Genitourinary Comments: Chaperone present. No concerning lesions noted. Moderate white/grey discharge. Fishy odor. Cervix unremarkable in appearance. No CMT, significant adnexal mass or tenderness.   Musculoskeletal: She exhibits no edema.  Neurological: She is alert.  Skin: Skin is warm and dry.  Psychiatric: She has a normal mood and affect.  Nursing note and vitals reviewed.    ED Treatments / Results  Labs (all labs ordered are listed, but only abnormal results are displayed) Labs Reviewed  WET PREP, GENITAL - Abnormal; Notable for the following:       Result Value   Clue Cells Wet Prep HPF POC PRESENT (*)    WBC, Wet Prep HPF POC MANY (*)    All other components  within normal limits  URINALYSIS, ROUTINE W REFLEX MICROSCOPIC (NOT AT [facility]) - Abnormal; Notable for the following:    Leukocytes, UA TRACE (*)    All other components within normal limits  BASIC METABOLIC PANEL - Abnormal; Notable for the following:    Calcium 8.8 (*)    All other components within normal limits  URINE MICROSCOPIC-ADD ON - Abnormal; Notable for the following:    Squamous Epithelial / LPF 0-5 (*)    Bacteria, UA FEW (*)    All other components within normal limits  URINE CULTURE  CBC  PREGNANCY, URINE  GC/CHLAMYDIA PROBE AMP ([place]) NOT AT [facility]    EKG  EKG Interpretation None        Radiology No results found.  Procedures Procedures (including critical care time)  Medications Ordered in ED Medications - No data to display   Initial Impression / Assessment and Plan / ED Course  I have reviewed the triage vital signs and the nursing notes.  Pertinent labs & imaging results that were available during my care of the patient were reviewed by me and considered in my medical decision making (see chart for details).  Clinical Course    20yF with RLQ/R flank pain. BV noted. Many WBC but no significant CMT or adnexal tenderness. Doubt TOA. Some concern for possible appy though. Pt declining recommendation for CT. She thinks she has a UTI based on past "kideny infections" and her current symptoms of urinary frequency. UA is not overly impressive though. Will place on keflex and send urine culture. 2g flagyl for BV. She is sexually active. She is declining empiric tx for STD. Strict return precautions were discussed.   Final Clinical Impressions(s) / ED Diagnoses   Final diagnoses:  Bacterial vaginosis  Right lower quadrant abdominal pain    New Prescriptions New Prescriptions   No medications on file     Raeford RazorStephen Khamari Yousuf, MD 09/02/15 [phone]

## 2015-09-03 LAB — GC/CHLAMYDIA PROBE AMP (~~LOC~~) NOT AT ARMC
Chlamydia: NEGATIVE
Neisseria Gonorrhea: NEGATIVE

## 2015-09-04 LAB — URINE CULTURE: Culture: NO GROWTH

## 2015-10-01 DIAGNOSIS — L709 Acne, unspecified: Secondary | ICD-10-CM | POA: Diagnosis not present

## 2016-01-15 ENCOUNTER — Encounter: Payer: Self-pay | Admitting: Adult Health

## 2016-01-15 ENCOUNTER — Ambulatory Visit (INDEPENDENT_AMBULATORY_CARE_PROVIDER_SITE_OTHER): Payer: BLUE CROSS/BLUE SHIELD | Admitting: Adult Health

## 2016-01-15 ENCOUNTER — Other Ambulatory Visit (HOSPITAL_COMMUNITY)
Admission: RE | Admit: 2016-01-15 | Discharge: 2016-01-15 | Disposition: A | Discharge status: Home / Self Care | Payer: Federal, State, Local not specified - PPO | Source: Ambulatory Visit | Attending: Adult Health | Admitting: Adult Health

## 2016-01-15 VITALS — BP 126/92 | HR 105 | Ht 67.0 in | Wt 142.0 lb

## 2016-01-15 DIAGNOSIS — N898 Other specified noninflammatory disorders of vagina: Secondary | ICD-10-CM

## 2016-01-15 DIAGNOSIS — Z01419 Encounter for gynecological examination (general) (routine) without abnormal findings: Secondary | ICD-10-CM | POA: Insufficient documentation

## 2016-01-15 DIAGNOSIS — Z30017 Encounter for initial prescription of implantable subdermal contraceptive: Secondary | ICD-10-CM | POA: Insufficient documentation

## 2016-01-15 DIAGNOSIS — Z01411 Encounter for gynecological examination (general) (routine) with abnormal findings: Secondary | ICD-10-CM | POA: Diagnosis not present

## 2016-01-15 DIAGNOSIS — Z113 Encounter for screening for infections with a predominantly sexual mode of transmission: Secondary | ICD-10-CM | POA: Diagnosis not present

## 2016-01-15 DIAGNOSIS — Z3009 Encounter for other general counseling and advice on contraception: Secondary | ICD-10-CM

## 2016-01-15 DIAGNOSIS — N760 Acute vaginitis: Secondary | ICD-10-CM | POA: Diagnosis not present

## 2016-01-15 DIAGNOSIS — B9689 Other specified bacterial agents as the cause of diseases classified elsewhere: Secondary | ICD-10-CM | POA: Diagnosis not present

## 2016-01-15 LAB — POCT WET PREP (WET MOUNT)
Clue Cells Wet Prep Whiff POC: NEGATIVE
WBC WET PREP: POSITIVE

## 2016-01-15 MED ORDER — METRONIDAZOLE 500 MG PO TABS: 500.0000 mg | ORAL_TABLET | Freq: Three times a day (TID) | ORAL | 0 refills | Status: DC

## 2016-01-15 NOTE — Progress Notes (Signed)
Patient ID: Pearson ForsterMaison Ruark, female   DOB: [birth year], 21 y.o.   MRN: [id number] History of Present Illness: Brigitte PulseMaison is a 21 year old white female in for well woman gyn exam and pap, and has vaginal discharge and itching. [facility]CP is [facility].    Current Medications, Allergies, Past Medical History, Past Surgical History, Family History and Social History were reviewed in [facility] electronic medical record.     Review of Systems: Patient denies any headaches, hearing loss, fatigue, blurred vision, shortness of breath, chest pain, abdominal pain, problems with bowel movements, urination, or intercourse. No joint pain or mood swings.+vaginal discharge and itching.    Physical Exam:BP (!) 126/92 (BP Location: Left Arm, Patient Position: Sitting, Cuff Size: Normal)   Pulse (!) 105   Ht 5\' 7"  (1.702 m)   Wt 142 lb (64.4 kg)   LMP 12/22/2015 (Exact Date)   BMI 22.24 kg/m  General:  Well developed, well nourished, no acute distress Skin:  Warm and dry, has navel pierced and studs in back Neck:  Midline trachea, normal thyroid, good ROM, no lymphadenopathy Lungs; Clear to auscultation bilaterally Breast:  No dominant palpable mass, retraction, or nipple discharge, has bilateral nipple rods Cardiovascular: Regular rate and rhythm Abdomen:  Soft, non tender, no hepatosplenomegaly Pelvic:  External genitalia is normal in appearance, no lesions.  The vagina is normal in appearance, except for creaming discharge, no odor. Urethra has no lesions or masses. The cervix is nulliparous , pap with GC/CHL performed.  Uterus is felt to be normal size, shape, and contour.  No adnexal masses or tenderness noted.Bladder is non tender, no masses felt.Wet prep:+WBC and clue cells. Extremities/musculoskeletal:  No swelling or varicosities noted, no clubbing or cyanosis Psych:  No mood changes, alert and cooperative,seems happy PHQ 2 score 1. She declines HIV and RPR today.Encouraged to always use condoms and  stop smoking pot. Discussed births control options is interested in nexplanon.   Impression: 1. Encounter for gynecological examination with Papanicolaou smear of cervix   2. Vaginal discharge   3. BV (bacterial vaginosis)   4.      Contraceptive ed.    Plan: Meds ordered this encounter  Medications  . metroNIDAZOLE (FLAGYL) 500 MG tablet    Sig: Take 1 tablet (500 mg total) by mouth 3 (three) times daily.    Dispense:  21 tablet    Refill:  0    Order Specific Question:   Supervising Provider    Answer:   Duane LopeEURE, LUTHER H [[id number]]  Review handout on BV, no alcohol Use condoms Review handout on nexplanon. And let me know if desires, to check insurance

## 2016-01-15 NOTE — Patient Instructions (Addendum)
Bacterial Vaginosis Bacterial vaginosis is a vaginal infection that occurs when the normal balance of bacteria in the vagina is disrupted. It results from an overgrowth of certain bacteria. This is the most common vaginal infection among women ages 82-44. Because bacterial vaginosis increases your risk for STIs (sexually transmitted infections), getting treated can help reduce your risk for chlamydia, gonorrhea, herpes, and HIV (human immunodeficiency virus). Treatment is also important for preventing complications in pregnant women, because this condition can cause an early (premature) delivery. What are the causes? This condition is caused by an increase in harmful bacteria that are normally present in small amounts in the vagina. However, the reason that the condition develops is not fully understood. What increases the risk? The following factors may make you more likely to develop this condition:  Having a new sexual partner or multiple sexual partners.  Having unprotected sex.  Douching.  Having an intrauterine device (IUD).  Smoking.  Drug and alcohol abuse.  Taking certain antibiotic medicines.  Being pregnant. You cannot get bacterial vaginosis from toilet seats, bedding, swimming pools, or contact with objects around you. What are the signs or symptoms? Symptoms of this condition include:  Grey or white vaginal discharge. The discharge can also be watery or foamy.  A fish-like odor with discharge, especially after sexual intercourse or during menstruation.  Itching in and around the vagina.  Burning or pain with urination. Some women with bacterial vaginosis have no signs or symptoms. How is this diagnosed? This condition is diagnosed based on:  Your medical history.  A physical exam of the vagina.  Testing a sample of vaginal fluid under a microscope to look for a large amount of bad bacteria or abnormal cells. Your health care provider may use a cotton swab or a  small wooden spatula to collect the sample. How is this treated? This condition is treated with antibiotics. These may be given as a pill, a vaginal cream, or a medicine that is put into the vagina (suppository). If the condition comes back after treatment, a second round of antibiotics may be needed. Follow these instructions at home: Medicines  Take over-the-counter and prescription medicines only as told by your health care provider.  Take or use your antibiotic as told by your health care provider. Do not stop taking or using the antibiotic even if you start to feel better. General instructions  If you have a female sexual partner, tell her that you have a vaginal infection. She should see her health care provider and be treated if she has symptoms. If you have a female sexual partner, he does not need treatment.  During treatment:  Avoid sexual activity until you finish treatment.  Do not douche.  Avoid alcohol as directed by your health care provider.  Avoid breastfeeding as directed by your health care provider.  Drink enough water and fluids to keep your urine clear or pale yellow.  Keep the area around your vagina and rectum clean.  Wash the area daily with warm water.  Wipe yourself from front to back after using the toilet.  Keep all follow-up visits as told by your health care provider. This is important. How is this prevented?  Do not douche.  Wash the outside of your vagina with warm water only.  Use protection when having sex. This includes latex condoms and dental dams.  Limit how many sexual partners you have. To help prevent bacterial vaginosis, it is best to have sex with just one partner (monogamous).  Make sure you and your sexual partner are tested for STIs.  Wear cotton or cotton-lined underwear.  Avoid wearing tight pants and pantyhose, especially during summer.  Limit the amount of alcohol that you drink.  Do not use any products that contain  nicotine or tobacco, such as cigarettes and e-cigarettes. If you need help quitting, ask your health care provider.  Do not use illegal drugs. Where to find more information:  Centers for Disease Control and Prevention: SolutionApps.co.zawww.cdc.gov/std  American Sexual Health Association (ASHA): www.ashastd.org  U.S. Department of Health and Health and safety inspectorHuman Services, Office on Women's Health: ConventionalMedicines.siwww.womenshealth.gov/ or http://www.anderson-williamson.info/https://www.womenshealth.gov/a-z-topics/bacterial-vaginosis Contact a health care provider if:  Your symptoms do not improve, even after treatment.  You have more discharge or pain when urinating.  You have a fever.  You have pain in your abdomen.  You have pain during sex.  You have vaginal bleeding between periods. Summary  Bacterial vaginosis is a vaginal infection that occurs when the normal balance of bacteria in the vagina is disrupted.  Because bacterial vaginosis increases your risk for STIs (sexually transmitted infections), getting treated can help reduce your risk for chlamydia, gonorrhea, herpes, and HIV (human immunodeficiency virus). Treatment is also important for preventing complications in pregnant women, because the condition can cause an early (premature) delivery.  This condition is treated with antibiotic medicines. These may be given as a pill, a vaginal cream, or a medicine that is put into the vagina (suppository). This information is not intended to replace advice given to you by your health care provider. Make sure you discuss any questions you have with your health care provider. Document Released: 12/29/2004 Document Revised: 09/14/2015 Document Reviewed: 09/14/2015 Elsevier Interactive Patient Education  2017 Elsevier Inc. No alcohol  Physical in 1 year

## 2016-01-17 LAB — CYTOLOGY - PAP
Chlamydia: NEGATIVE
DIAGNOSIS: NEGATIVE
NEISSERIA GONORRHEA: NEGATIVE

## 2016-02-06 DIAGNOSIS — N390 Urinary tract infection, site not specified: Secondary | ICD-10-CM | POA: Diagnosis not present

## 2016-02-06 DIAGNOSIS — N12 Tubulo-interstitial nephritis, not specified as acute or chronic: Secondary | ICD-10-CM | POA: Diagnosis not present

## 2016-04-16 ENCOUNTER — Ambulatory Visit (INDEPENDENT_AMBULATORY_CARE_PROVIDER_SITE_OTHER): Payer: Federal, State, Local not specified - PPO | Admitting: Adult Health

## 2016-04-16 ENCOUNTER — Encounter: Payer: Self-pay | Admitting: Adult Health

## 2016-04-16 VITALS — BP 110/70 | HR 68 | Ht 67.0 in | Wt 143.0 lb

## 2016-04-16 DIAGNOSIS — R8290 Unspecified abnormal findings in urine: Secondary | ICD-10-CM | POA: Diagnosis not present

## 2016-04-16 DIAGNOSIS — Z30011 Encounter for initial prescription of contraceptive pills: Secondary | ICD-10-CM | POA: Diagnosis not present

## 2016-04-16 DIAGNOSIS — N390 Urinary tract infection, site not specified: Secondary | ICD-10-CM | POA: Insufficient documentation

## 2016-04-16 DIAGNOSIS — Z3202 Encounter for pregnancy test, result negative: Secondary | ICD-10-CM

## 2016-04-16 LAB — POCT URINALYSIS DIPSTICK
GLUCOSE UA: NEGATIVE
Ketones, UA: NEGATIVE
LEUKOCYTES UA: NEGATIVE
NITRITE UA: POSITIVE
Protein, UA: NEGATIVE
RBC UA: NEGATIVE

## 2016-04-16 LAB — POCT URINE PREGNANCY: Preg Test, Ur: NEGATIVE

## 2016-04-16 MED ORDER — SULFAMETHOXAZOLE-TRIMETHOPRIM 800-160 MG PO TABS: 1.0000 | ORAL_TABLET | Freq: Two times a day (BID) | ORAL | 0 refills | Status: DC

## 2016-04-16 MED ORDER — NORETHIN-ETH ESTRAD-FE BIPHAS 1 MG-10 MCG / 10 MCG PO TABS: 1.0000 | ORAL_TABLET | Freq: Every day | ORAL | 11 refills | Status: DC

## 2016-04-16 NOTE — Progress Notes (Signed)
Subjective:     Patient ID: Leslie Simon, female   DOB: 12/29/1995, 21 y.o.   MRN: [id number]  HPI Daisy is a 21 year old white female in wanting to start OCs, and has strong odor in urine, no pain.   Review of Systems Urine has strong odor No pain  Reviewed past medical,surgical, social and family history. Reviewed medications and allergies.     Objective:   Physical Exam BP 110/70 (BP Location: Left Arm, Patient Position: Sitting, Cuff Size: Normal)   Pulse 68   Ht  (1.702 m)   Wt 143 lb (64.9 kg)   LMP 04/05/2016   BMI 22.40 kg/m Urine +nitrates, UPT negative.No CVAT. Will rx lo loestrin, and will treat for UTI with septra ds.    Assessment:     1. Encounter for initial prescription of contraceptive pills   2. Abnormal urine odor   3. Urinary tract infection without hematuria, site unspecified   4. Pregnancy examination or test, negative result       Plan:     Meds ordered this encounter  Medications  . Phenazopyridine HCl (AZO URINARY PAIN PO)    Sig: Take by mouth as needed.  . Norethindrone-Ethinyl Estradiol-Fe Biphas (LO LOESTRIN FE) 1 MG-10 MCG / 10 MCG tablet    Sig: Take 1 tablet by mouth daily. Take 1 daily by mouth    Dispense:  1 Package    Refill:  11    BIN [id number], PCN [id number], GRP [id number]    Order Specific Question:   Supervising Provider    Answer:   Duane Lope H [[id number]]  . sulfamethoxazole-trimethoprim (BACTRIM DS,SEPTRA DS) 800-160 MG tablet    Sig: Take 1 tablet by mouth 2 (two) times daily.    Dispense:  14 tablet    Refill:  0    Order Specific Question:   Supervising Provider    Answer:   Duane Lope H [[id number]]  Push fluids Start lo loestrin today Use condoms Follow up in 3 months

## 2016-06-16 IMAGING — CT CT RENAL STONE PROTOCOL
3 of 4 series · 7 of 46 positions shown, 13 images · non-contrast
Comparison: 11/21/2014.

CLINICAL DATA: UTI. RIGHT flank pain. Worse flank pain despite
antibiotic treatment.

EXAM:
CT ABDOMEN AND PELVIS WITHOUT CONTRAST
TECHNIQUE: Multidetector CT imaging of the abdomen and pelvis was performed
following the standard protocol without IV contrast.

[Series 3: lung 5.0 b60f · axial · 0.62mm/px · z∈[+30,+70]mm · 3 of 17 slices shown, 7 images]
[im 5/17  soft-tissue]
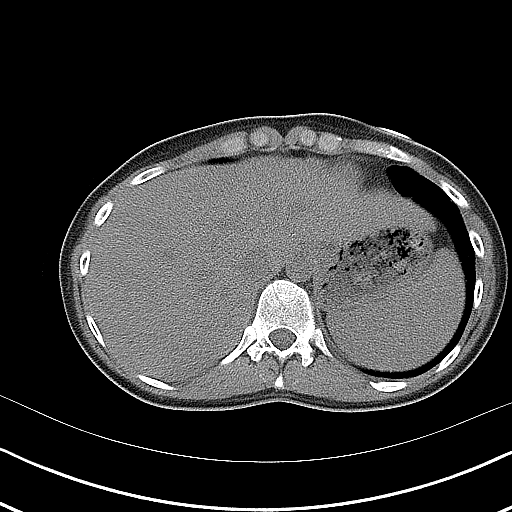
[im 5/17  lung]
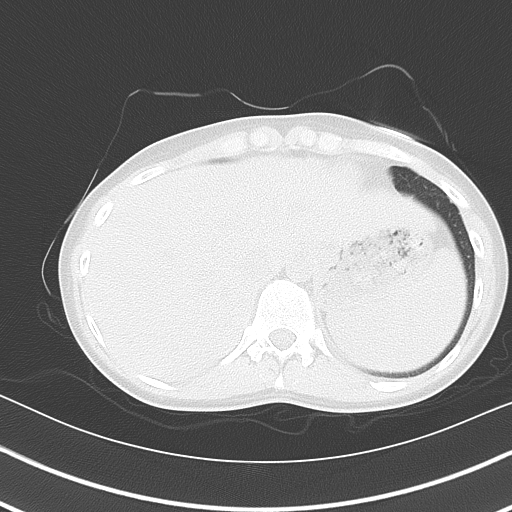
[im 5/17  bone]
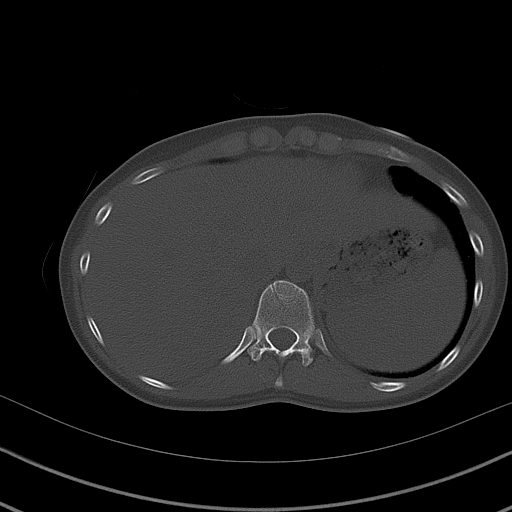
[im 9/17  soft-tissue]
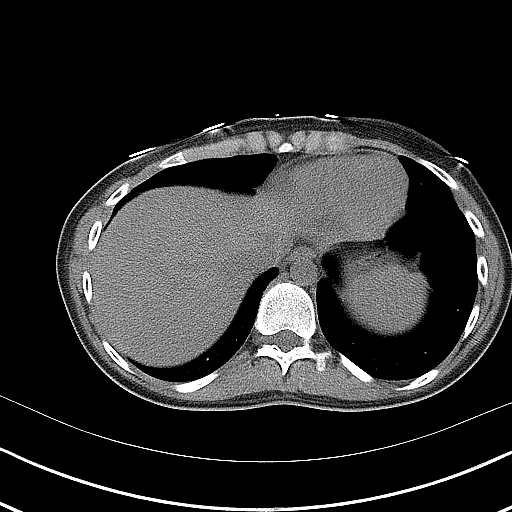
[im 9/17  lung]
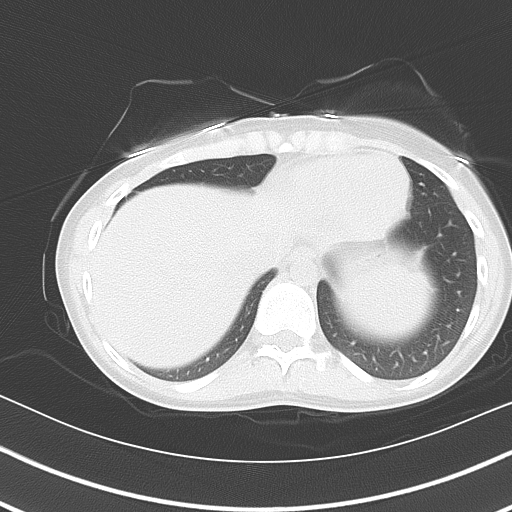
[im 13/17  soft-tissue]
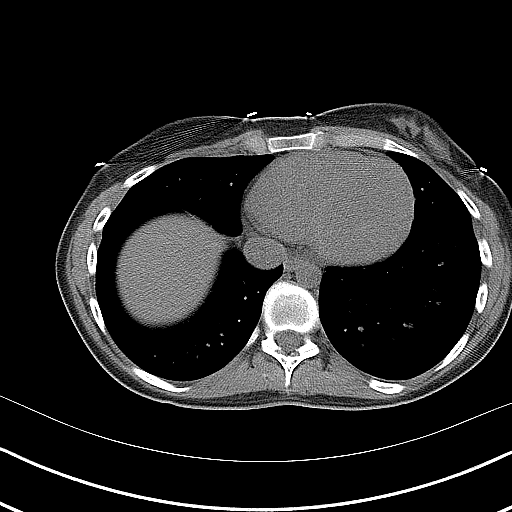
[im 13/17  lung]
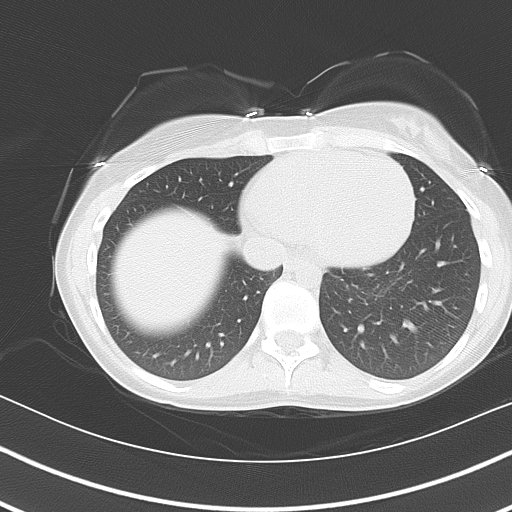

[Series 4: mpr coronal 3.0mm · coronal · 0.61mm/px · 3 of 64 slices shown, 4 images]
[im 22/64  soft-tissue]
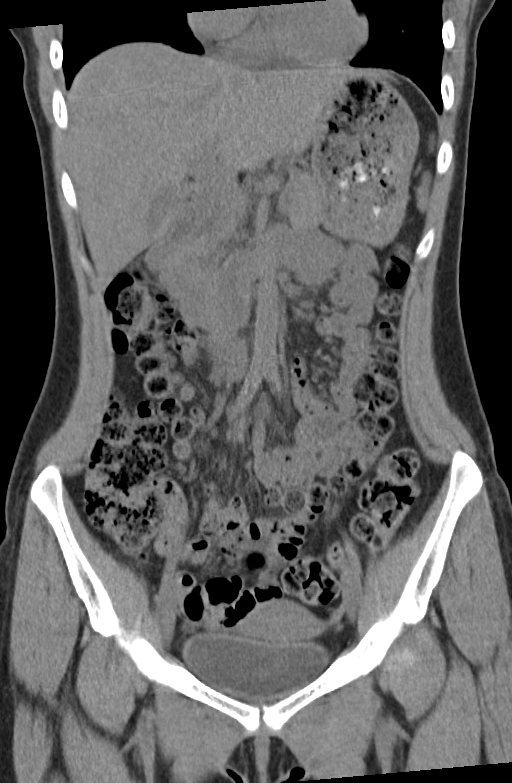
[im 29/64  soft-tissue]
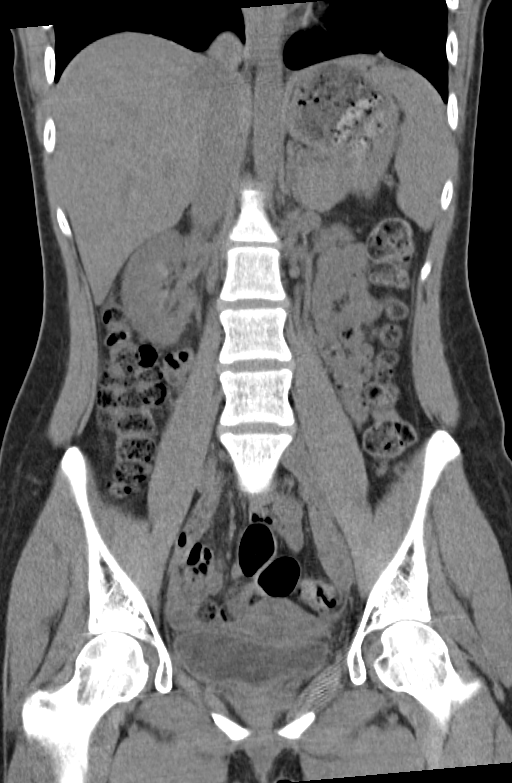
[im 29/64  bone]
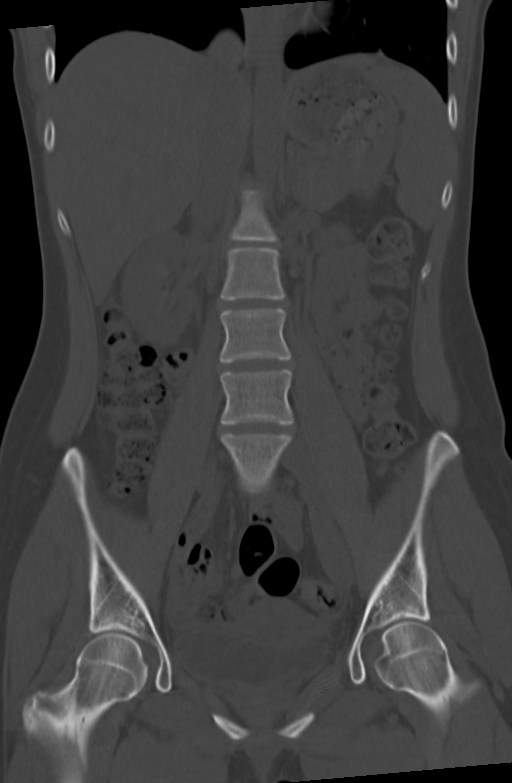
[im 36/64  soft-tissue]
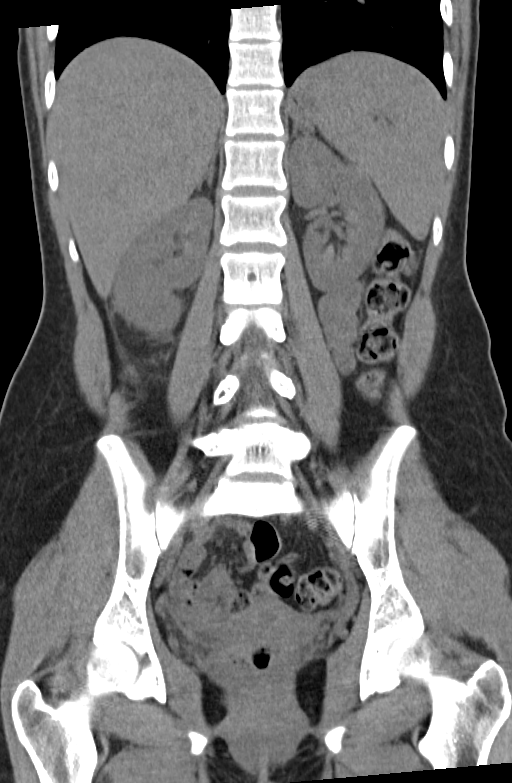

[Series 5: mpr sagittal 3.0mm · sagittal · 0.45mm/px · 1 of 101 slices shown, 2 images]
[im 34/101  soft-tissue]
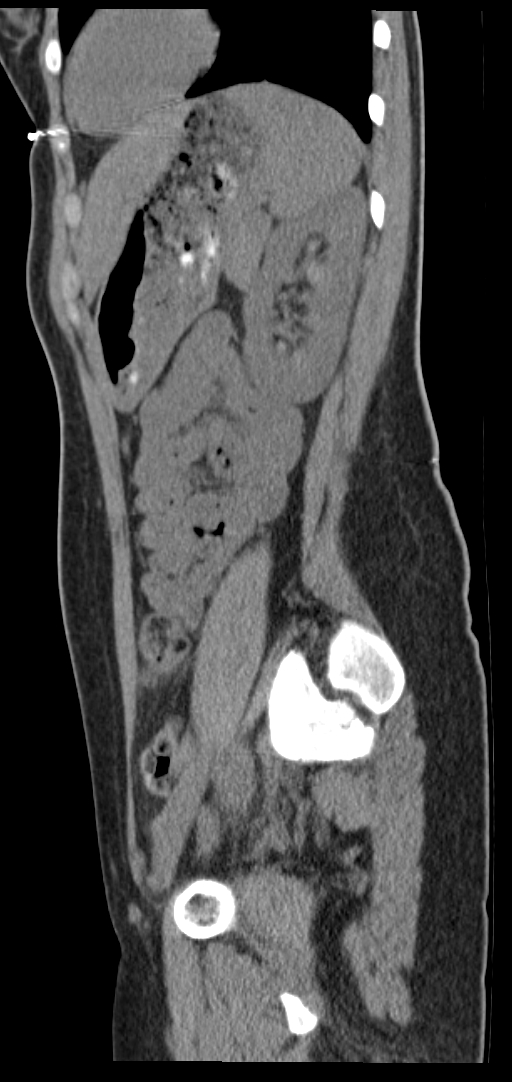
[im 34/101  bone]
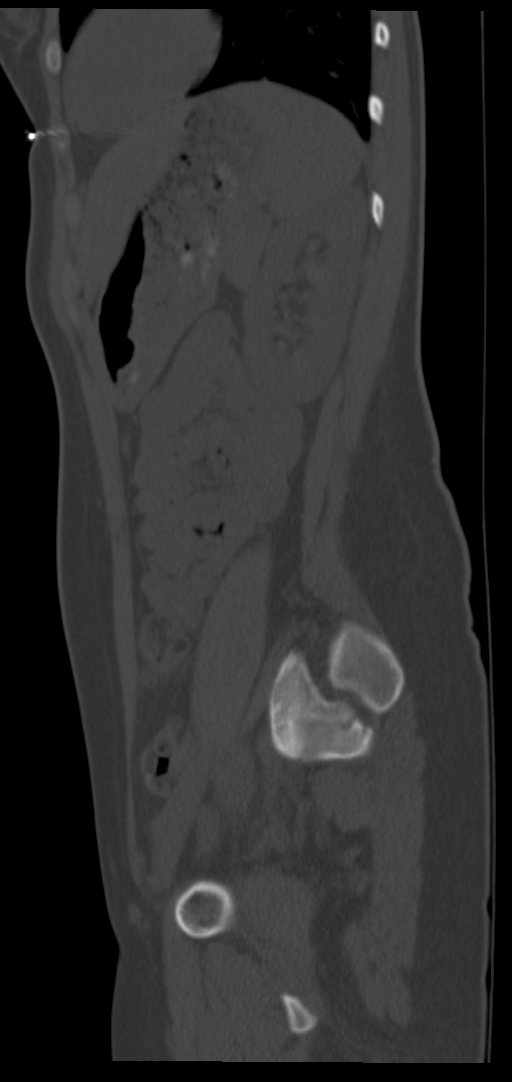

[7 of 46 positions shown; findings below may reference images not displayed]

FINDINGS: Lower chest:  No acute findings.

Hepatobiliary: No mass visualized on this un-enhanced exam.

Pancreas: No mass or inflammatory process identified on this
un-enhanced exam.

Spleen: Within normal limits in size.

Adrenals/Urinary Tract: No evidence of urolithiasis or
hydronephrosis. There is RIGHT perinephric stranding. Changes of
RIGHT pyelonephritis are not excluded.

Stomach/Bowel: No evidence of obstruction, inflammatory process, or
abnormal fluid collections.

Vascular/Lymphatic: No pathologically enlarged lymph nodes. No
evidence of abdominal aortic aneurysm.

Reproductive: No mass or other significant abnormality.

Other: None.

Musculoskeletal:  No suspicious bone lesions identified.
IMPRESSION: RIGHT-sided perinephric stranding could represent pyelonephritis.
There is no obstructive uropathy or nephroureterolithiasis.

## 2016-07-16 ENCOUNTER — Ambulatory Visit: Payer: Federal, State, Local not specified - PPO | Admitting: Adult Health

## 2016-08-10 DIAGNOSIS — R070 Pain in throat: Secondary | ICD-10-CM | POA: Diagnosis not present

## 2016-08-10 DIAGNOSIS — J060 Acute laryngopharyngitis: Secondary | ICD-10-CM | POA: Diagnosis not present

## 2016-08-10 DIAGNOSIS — Z6821 Body mass index (BMI) 21.0-21.9, adult: Secondary | ICD-10-CM | POA: Diagnosis not present

## 2016-08-10 DIAGNOSIS — N390 Urinary tract infection, site not specified: Secondary | ICD-10-CM | POA: Diagnosis not present

## 2016-09-10 ENCOUNTER — Encounter: Payer: Self-pay | Admitting: Adult Health

## 2016-09-10 ENCOUNTER — Ambulatory Visit (INDEPENDENT_AMBULATORY_CARE_PROVIDER_SITE_OTHER): Payer: Federal, State, Local not specified - PPO | Admitting: Adult Health

## 2016-09-10 VITALS — BP 100/60 | HR 80 | Ht 67.0 in | Wt 140.0 lb

## 2016-09-10 DIAGNOSIS — Z3009 Encounter for other general counseling and advice on contraception: Secondary | ICD-10-CM | POA: Diagnosis not present

## 2016-09-10 DIAGNOSIS — Z3202 Encounter for pregnancy test, result negative: Secondary | ICD-10-CM | POA: Diagnosis not present

## 2016-09-10 LAB — POCT URINE PREGNANCY: Preg Test, Ur: NEGATIVE

## 2016-09-10 NOTE — Progress Notes (Signed)
Subjective:     Patient ID: Pearson ForsterMaison Delpilar, female   DOB: [birth year], 21 y.o.   MRN: [id number]  HPI Brigitte PulseMaison is a 21 year old white female in to discuss birth control, she did not take OCs regularly, and had decreased libido, so stopped them.   Review of Systems Decreased libido on OCs Reviewed past medical,surgical, social and family history. Reviewed medications and allergies.     Objective:   Physical Exam BP 100/60 (BP Location: Left Arm, Patient Position: Sitting, Cuff Size: Small)   Pulse 80   Ht 5\' 7"  (1.702 m)   Wt 140 lb (63.5 kg)   LMP 08/30/2016   BMI 21.93 kg/m UPT negative, talk only,she wants nexplanon.Discussed could have decreased libido and irregular periods with any birth control.     Assessment:     1. Encounter for education about contraceptive use   2. Pregnancy examination or test, negative result       Plan:     Use condoms Return for nexplanon insertion 9/17 at 2 pm, if period before then call    Review handout on nexplanon

## 2016-09-10 NOTE — Patient Instructions (Signed)
Use condoms Return  9/17 at 2 pm for nexplanon call if period before then

## 2016-09-28 ENCOUNTER — Encounter: Payer: Federal, State, Local not specified - PPO | Admitting: Adult Health

## 2016-09-30 ENCOUNTER — Ambulatory Visit (INDEPENDENT_AMBULATORY_CARE_PROVIDER_SITE_OTHER): Payer: Federal, State, Local not specified - PPO | Admitting: Advanced Practice Midwife

## 2016-09-30 ENCOUNTER — Encounter: Payer: Self-pay | Admitting: Advanced Practice Midwife

## 2016-09-30 VITALS — BP 110/80 | HR 53 | Ht 67.0 in | Wt 145.0 lb

## 2016-09-30 DIAGNOSIS — Z3202 Encounter for pregnancy test, result negative: Secondary | ICD-10-CM

## 2016-09-30 DIAGNOSIS — Z3046 Encounter for surveillance of implantable subdermal contraceptive: Secondary | ICD-10-CM | POA: Diagnosis not present

## 2016-09-30 DIAGNOSIS — Z30017 Encounter for initial prescription of implantable subdermal contraceptive: Secondary | ICD-10-CM

## 2016-09-30 LAB — POCT URINE PREGNANCY: Preg Test, Ur: NEGATIVE

## 2016-09-30 MED ORDER — ETONOGESTREL 68 MG ~~LOC~~ IMPL
68.0000 mg | DRUG_IMPLANT | Freq: Once | SUBCUTANEOUS | Status: AC
  Administered 2016-09-30: 68 mg via SUBCUTANEOUS

## 2016-09-30 NOTE — Addendum Note (Signed)
Addended by: Colen Darling on: 09/30/2016 09:34 AM   Modules accepted: Orders

## 2016-09-30 NOTE — Progress Notes (Signed)
  HPI:  Leslie Simon is a 21 y.o. year old Caucasian female here for Nexplanon insertion.  Her LMP was 09/29/16 , and her pregnancy test today was negative.  Risks/benefits/side effects of Nexplanon have been discussed and her questions have been answered.  Specifically, a failure rate of 01/998 has been reported, with an increased failure rate if pt takes St. John's Wort and/or antiseizure medicaitons.  Leslie Simon is aware of the common side effect of irregular bleeding, which the incidence of decreases over time.   Past Medical History: Past Medical History:  Diagnosis Date  . Kidney infection     Past Surgical History: Past Surgical History:  Procedure Laterality Date  . TOOTH EXTRACTION      Family History: Family History  Problem Relation Age of Onset  . Heart disease Unknown   . Cancer Maternal Grandmother   . Diabetes Maternal Grandmother   . Heart disease Maternal Grandmother   . Bipolar disorder Father   . Hypertension Mother   . High Cholesterol Mother     Social History: Social History  Substance Use Topics  . Smoking status: Former Smoker    Years: 0.50    Types: Cigarettes  . Smokeless tobacco: Never Used  . Alcohol use No    Allergies: No Known Allergies    Her left arm, approximatly 4 inches proximal from the elbow, was cleansed with alcohol and anesthetized with 2cc of 2% Lidocaine.  The area was cleansed again and the Nexplanon was inserted without difficulty.  A pressure bandage was applied.  Pt was instructed to remove pressure bandage in a few hours, and keep insertion site covered with a bandaid for 3 days.  Back up contraception was recommended for none.  Follow-up scheduled PRN problems  CRESENZO-DISHMAN,Janoah Menna 09/30/2016 9:19 AM

## 2016-09-30 NOTE — Patient Instructions (Signed)
  Nexplanon   Contraceptive Implant Information A contraceptive implant is a plastic rod that is inserted under your skin. It is usually inserted under the skin of your upper arm. It continually releases small amounts of progestin (synthetic progesterone) into your bloodstream. This prevents an egg from being released from your ovaries. It also thickens your cervical mucus to prevent sperm from entering the cervix, and it thins your uterine lining to prevent a fertilized egg from attaching to your uterus. Contraceptive implants can be effective for up to 3 years. They do not provide protection against sexually transmitted diseases (STDs).  The procedure to insert an implant usually takes about 10 minutes. There may be minor bruising, swelling, and discomfort at the insertion site for a couple days. The implant begins to work within the first day. Other contraceptive protection may be necessary for 7 days. Be sure to discuss with your health care provider if you need a backup method of contraception.  Your health care provider will make sure you are a good candidate for the contraceptive implant. Discuss with your health care provider the possible side effects of the implant. ADVANTAGES It prevents pregnancy for up to 3 years. It is easily reversible. It is convenient. It can be used when breastfeeding. It can be used by women who cannot take estrogen. DISADVANTAGES You may have irregular or unplanned vaginal bleeding. You may develop side effects, including headache, weight gain, acne, breast tenderness, or mood changes. You may have tissue or nerve damage after insertion (rare). It may be difficult and uncomfortable to remove. Certain medicines may interfere with the effectiveness of the implant.  REMOVAL OF IMPLANT The implant should be removed in 3 years or as directed by your health care provider. The implant's effect wears off in a few hours after removal. Your ability to get pregnant  (fertility) may be restored in 1-2 weeks. A new implant can be inserted as soon as the old one is removed if desired. CONTRAINDICATIONS You should not get the implant if you are experiencing any of the following situations: You are pregnant. You have a history of breast cancer, osteoporosis, blood clots, heart disease, diabetes, high blood pressure, liver disease, tumors, or stroke.   You have undiagnosed vaginal bleeding. You have a sensitivity to any part of the implant. This information is not intended to replace advice given to you by your health care provider. Make sure you discuss any questions you have with your health care provider. Document Released: 12/18/2010 Document Revised: 08/31/2012 Document Reviewed: 06/27/2012 Elsevier Interactive Patient Education  2017 Elsevier Inc.    

## 2016-10-09 ENCOUNTER — Telehealth: Payer: Self-pay | Admitting: *Deleted

## 2016-10-09 NOTE — Telephone Encounter (Signed)
Patient called with complaints of arm hurting where Nexplanon was inserted. States she cannot sleep on her arm or bend it. The area is still bruised but is not swollen or red. Informed patient that it can take a little time for it to get settled. Offered to see patient today to evaluate site but patient declined stating she just wanted to make an appt to have it taken out.

## 2016-10-13 ENCOUNTER — Encounter: Payer: Self-pay | Admitting: Adult Health

## 2016-10-13 ENCOUNTER — Ambulatory Visit (INDEPENDENT_AMBULATORY_CARE_PROVIDER_SITE_OTHER): Payer: Federal, State, Local not specified - PPO | Admitting: Adult Health

## 2016-10-13 VITALS — BP 110/60 | HR 84 | Ht 67.0 in | Wt 141.0 lb

## 2016-10-13 DIAGNOSIS — Z3202 Encounter for pregnancy test, result negative: Secondary | ICD-10-CM | POA: Diagnosis not present

## 2016-10-13 DIAGNOSIS — Z3046 Encounter for surveillance of implantable subdermal contraceptive: Secondary | ICD-10-CM | POA: Diagnosis not present

## 2016-10-13 DIAGNOSIS — Z30016 Encounter for initial prescription of transdermal patch hormonal contraceptive device: Secondary | ICD-10-CM | POA: Diagnosis not present

## 2016-10-13 LAB — POCT URINE PREGNANCY: Preg Test, Ur: NEGATIVE

## 2016-10-13 MED ORDER — NORELGESTROMIN-ETH ESTRADIOL 150-35 MCG/24HR TD PTWK: 1.0000 | MEDICATED_PATCH | TRANSDERMAL | 12 refills | Status: DC

## 2016-10-13 NOTE — Patient Instructions (Signed)
Bedbugs Bedbugs are tiny bugs that live in and around beds. During the day, they stay hidden. At night, they come out and bite. Where are bedbugs found? Bedbugs can be found anywhere. It does not matter if a place is clean or dirty. They are often found in:  Hotels.  Shelters.  Dorms.  Hospitals.  Nursing homes.  Places where there are many birds or bats.  What are bedbug bites like? A bedbug bite leaves a small red bump with a darker red dot in the middle. The bump may show up soon after a person is bitten or a day or more later. Bedbug bites usually do not hurt, but they may itch. Most people do not need treatment for bedbug bites. The bumps usually go away on their own in a few days. How do I check for bedbugs? Bedbugs are reddish-brown, oval, and flat. They are very small and they cannot fly. Look for bedbugs in these places:  On mattresses, bed frames, headboards, and box springs.  On drapes and curtains in bedrooms.  Under the carpet in bedrooms.  Behind electrical outlets.  Behind any wallpaper that is peeling.  Inside luggage.  Also look for black or red spots or stains on or near the bed. What should I do if I find bedbugs? When Traveling Check your clothes, suitcase, and belongings for bedbugs before you go back home. You may want to throw away anything that has bedbugs on it. At Home Your bedroom may need to be treated by a pest control expert. You may also need to throw away mattresses or luggage. To help keep bedbugs from coming back, you may want to:  Put a plastic cover over your mattress.  Wash your clothes and bedding in water that is hotter than 120F (48.9C). Dry them on a hot setting.  Vacuum often around the bed and in all of the cracks where the bugs might hide.  Check all used furniture, bedding, or clothes that you bring into your home.  Get rid of bird nests and bat roosts that are near your home.  In Your Bed Try wearing pajamas that  have long sleeves and pant legs. Bedbugs usually bite areas of the skin that are not covered. This information is not intended to replace advice given to you by your health care provider. Make sure you discuss any questions you have with your health care provider. Document Released: 04/15/2010 Document Revised: 06/06/2015 Document Reviewed: 12/25/2013 Elsevier Interactive Patient Education  2018 [employer]. Put patch on today, use condoms  F/u with in 3 months Use condoms , keep clean and dry x 24 hours, no heavy lifting, keep steri strips on x 72 hours, Keep pressure dressing on x 24 hours. Follow up prn problems.

## 2016-10-13 NOTE — Progress Notes (Addendum)
Subjective:     Patient ID: Leslie Simon, female   DOB: 1995/09/26, 21 y.o.   MRN: [id number]  HPI Leslie Simon is a 21 year old white female in for nexplanon removal,just had inserted 09/29/16 and says it hurts and she wants it out, wants to try the patch.   Review of Systems Has pain at nexplanon site and wants it removed  Reviewed past medical,surgical, social and family history. Reviewed medications and allergies.     Objective:   Physical Exam BP 110/60 (BP Location: Right Arm, Patient Position: Sitting, Cuff Size: Normal)   Pulse 84   Ht  (1.702 m)   Wt 141 lb (64 kg)   LMP 09/29/2016 (Exact Date)   BMI 22.08 kg/m  UPT negative. Consent signed, time out called. Left arm cleansed with betadine, and injected with 1.5 cc 1% lidocaine and waited til numb.Under sterile technique a #11 blade was used to make small vertical incision, and a curved forceps was used to easily remove rod. Steri strips applied. Pressure dressing applied.   She wants to try the patch for birth control, discussed patch with her.   Assessment:     1. Encounter for Nexplanon removal   2. Pregnancy examination or test, negative result   3. Encounter for initial prescription of transdermal patch hormonal contraceptive device       Plan:     Use condoms, keep clean and dry x 24 hours, no heavy lifting, keep steri strips on x 72 hours, Keep pressure dressing on x 24 hours. Follow up prn problems.   Put patch on today in pantie area Meds ordered this encounter  Medications  . norelgestromin-ethinyl estradiol (ORTHO EVRA) 150-35 MCG/24HR transdermal patch    Sig: Place 1 patch onto the skin once a week.    Dispense:  3 patch    Refill:  12    Order Specific Question:   Supervising Provider    Answer:   Duane Lope H [[id number]]  F/U in 3 months

## 2017-01-20 ENCOUNTER — Encounter: Payer: Self-pay | Admitting: Adult Health

## 2017-01-20 ENCOUNTER — Encounter: Payer: Federal, State, Local not specified - PPO | Admitting: Adult Health

## 2017-01-20 ENCOUNTER — Ambulatory Visit (INDEPENDENT_AMBULATORY_CARE_PROVIDER_SITE_OTHER): Payer: Federal, State, Local not specified - PPO | Admitting: Adult Health

## 2017-01-20 ENCOUNTER — Other Ambulatory Visit: Payer: Self-pay

## 2017-01-20 VITALS — BP 90/60 | HR 77 | Ht 67.5 in | Wt 146.0 lb

## 2017-01-20 DIAGNOSIS — Z113 Encounter for screening for infections with a predominantly sexual mode of transmission: Secondary | ICD-10-CM | POA: Diagnosis not present

## 2017-01-20 DIAGNOSIS — Z01419 Encounter for gynecological examination (general) (routine) without abnormal findings: Secondary | ICD-10-CM

## 2017-01-20 DIAGNOSIS — Z3045 Encounter for surveillance of transdermal patch hormonal contraceptive device: Secondary | ICD-10-CM

## 2017-01-20 MED ORDER — NORELGESTROMIN-ETH ESTRADIOL 150-35 MCG/24HR TD PTWK: 1.0000 | MEDICATED_PATCH | TRANSDERMAL | 12 refills | Status: AC

## 2017-01-20 NOTE — Progress Notes (Signed)
Patient ID: Leslie Simon, female   DOB: 1995-12-23, 23 y.o.   MRN: [id number] History of Present Illness:  Leslie Simon is a 22 year old white female in for a well woman gyn exam and get refills on ortho evra.She had normal pap 01/15/16.  Current Medications, Allergies, Past Medical History, Past Surgical History, Family History and Social History were reviewed in [facility] electronic medical record.     Review of Systems: Patient denies any headaches, hearing loss, fatigue, blurred vision, shortness of breath, chest pain, abdominal pain, problems with bowel movements, urination, or intercourse. No joint pain or mood swings. She is happy with the patch.     Physical Exam:BP 90/60 (BP Location: Left Arm, Patient Position: Sitting, Cuff Size: Normal)   Pulse 77   Ht 5' 7.5" (1.715 m)   Wt 146 lb (66.2 kg)   LMP 01/13/2017 (Approximate)   BMI 22.53 kg/m  General:  Well developed, well nourished, no acute distress Skin:  Warm and dry Neck:  Midline trachea, normal thyroid, good ROM, no lymphadenopathy Lungs; Clear to auscultation bilaterally Breast:  No dominant palpable mass, retraction, or nipple discharge,has bilateral nipple rods Cardiovascular: Regular rate and rhythm Abdomen:  Soft, non tender, no hepatosplenomegaly Pelvic:  External genitalia is normal in appearance, no lesions.  The vagina is normal in appearance. Urethra has no lesions or masses. The cervix is smooth.  Uterus is felt to be normal size, shape, and contour.  No adnexal masses or tenderness noted.Bladder is non tender, no masses felt.GC/CHL obtained. Extremities/musculoskeletal:  No swelling or varicosities noted, no clubbing or cyanosis Psych:  No mood changes, alert and cooperative,seems happy PHQ 2 score 0.She declines HIV and RPR.  Impression: 1. Encounter for well woman exam with routine gynecological exam   2. Encounter for surveillance of transdermal patch hormonal contraceptive device   3. Screening  examination for STD (sexually transmitted disease)       Plan: Meds ordered this encounter  Medications  . norelgestromin-ethinyl estradiol (ORTHO EVRA) 150-35 MCG/24HR transdermal patch    Sig: Place 1 patch onto the skin once a week.    Dispense:  3 patch    Refill:  12    Order Specific Question:   Supervising Provider    Answer:   Lazaro ArmsEURE, LUTHER H [[id number]]  GC/CHL sent Return in 1 year for physical  Pap in 2021

## 2017-01-21 NOTE — Progress Notes (Signed)
This encounter was created in error - please disregard.

## 2017-01-23 LAB — GC/CHLAMYDIA PROBE AMP
Chlamydia trachomatis, NAA: NEGATIVE
Neisseria gonorrhoeae by PCR: NEGATIVE

## 2017-03-15 DIAGNOSIS — R102 Pelvic and perineal pain: Secondary | ICD-10-CM | POA: Diagnosis not present

## 2017-03-15 DIAGNOSIS — N390 Urinary tract infection, site not specified: Secondary | ICD-10-CM | POA: Diagnosis not present

## 2017-03-22 DIAGNOSIS — H6120 Impacted cerumen, unspecified ear: Secondary | ICD-10-CM | POA: Diagnosis not present

## 2017-06-01 ENCOUNTER — Other Ambulatory Visit: Payer: Self-pay

## 2017-06-01 ENCOUNTER — Emergency Department (HOSPITAL_COMMUNITY)
Admission: EM | Admit: 2017-06-01 | Discharge: 2017-06-01 | Disposition: A | Discharge status: Home / Self Care | Payer: Federal, State, Local not specified - PPO | Attending: Emergency Medicine | Admitting: Emergency Medicine

## 2017-06-01 ENCOUNTER — Encounter (HOSPITAL_COMMUNITY): Payer: Self-pay | Admitting: Emergency Medicine

## 2017-06-01 DIAGNOSIS — Z87891 Personal history of nicotine dependence: Secondary | ICD-10-CM | POA: Insufficient documentation

## 2017-06-01 DIAGNOSIS — Y92007 Garden or yard of unspecified non-institutional (private) residence as the place of occurrence of the external cause: Secondary | ICD-10-CM | POA: Diagnosis not present

## 2017-06-01 DIAGNOSIS — S80812A Abrasion, left lower leg, initial encounter: Secondary | ICD-10-CM | POA: Insufficient documentation

## 2017-06-01 DIAGNOSIS — Z23 Encounter for immunization: Secondary | ICD-10-CM | POA: Diagnosis not present

## 2017-06-01 DIAGNOSIS — Y999 Unspecified external cause status: Secondary | ICD-10-CM | POA: Diagnosis not present

## 2017-06-01 DIAGNOSIS — X58XXXA Exposure to other specified factors, initial encounter: Secondary | ICD-10-CM | POA: Insufficient documentation

## 2017-06-01 DIAGNOSIS — S81802A Unspecified open wound, left lower leg, initial encounter: Secondary | ICD-10-CM

## 2017-06-01 DIAGNOSIS — Y939 Activity, unspecified: Secondary | ICD-10-CM | POA: Insufficient documentation

## 2017-06-01 MED ORDER — TETANUS-DIPHTH-ACELL PERTUSSIS 5-2.5-18.5 LF-MCG/0.5 IM SUSP
0.5000 mL | Freq: Once | INTRAMUSCULAR | Status: AC
Start: 2017-06-01 — End: 2017-06-01
  Administered 2017-06-01: 0.5 mL via INTRAMUSCULAR
  Filled 2017-06-01: qty 0.5

## 2017-06-01 NOTE — ED Notes (Signed)
ED Provider at bedside. 

## 2017-06-01 NOTE — Discharge Instructions (Addendum)
Watch for worsening redness swelling fevers or other signs of infection.  I think this was likely not a snakebite.

## 2017-06-01 NOTE — ED Triage Notes (Signed)
?   Bitten by a snake last night at 5 pm Pt reports not seeing nor feeling a bite  Was working in the yard Uncle looked on internet and thought from description was a snake bite and brought here for evaluation

## 2017-06-02 NOTE — ED Provider Notes (Signed)
[facility] EMERGENCY DEPARTMENT Provider Note   CSN: [id number] Arrival date & time: 06/01/17  2102     History   Chief Complaint Chief Complaint  Patient presents with  . Snake Bite    ? last night    HPI Leslie Simon is a 22 y.o. female.  HPI Patient thinks she could have been bitten by a snake last night.  States she was working in the yard and did not knowingly scraped on anything but states that today she found an itchy area on her left medial lower leg.  States it was in her socks but above her boots.  States she was not working and rambles.  No swelling.  Some erythema.  Slight pain at the site.  States her uncle thought it could be a snakebite because there are 2 different spots there. Past Medical History:  Diagnosis Date  . Kidney infection     Patient Active Problem List   Diagnosis Date Noted  . Encounter for initial prescription of transdermal patch hormonal contraceptive device 10/13/2016  . Pregnancy examination or test, negative result 04/16/2016    Past Surgical History:  Procedure Laterality Date  . TOOTH EXTRACTION       OB History    Gravida  0   Para  0   Term  0   Preterm  0   AB  0   Living  0     SAB  0   TAB  0   Ectopic  0   Multiple  0   Live Births  0            Home Medications    Prior to Admission medications   Medication Sig Start Date End Date Taking? Authorizing Provider  norelgestromin-ethinyl estradiol (ORTHO EVRA) 150-35 MCG/24HR transdermal patch Place 1 patch onto the skin once a week. 01/20/17   Adline Potter, NP    Family History Family History  Problem Relation Age of Onset  . Heart disease Unknown   . Cancer Maternal Grandmother   . Diabetes Maternal Grandmother   . Heart disease Maternal Grandmother   . Bipolar disorder Father   . Hypertension Mother   . High Cholesterol Mother     Social History Social History   Tobacco Use  . Smoking status: Former Smoker    Years: 0.50    Types: Cigarettes  . Smokeless tobacco: Never Used  Substance Use Topics  . Alcohol use: Yes    Comment: occ  . Drug use: Yes    Types: Marijuana    Comment: once daily     Allergies   Patient has no known allergies.   Review of Systems Review of Systems  Constitutional: Negative for appetite change.  Gastrointestinal: Negative for abdominal pain, nausea and vomiting.  Musculoskeletal: Negative for joint swelling and myalgias.  Skin: Positive for wound.  Neurological: Negative for weakness and numbness.     Physical Exam Updated Vital Signs BP 115/81   Pulse 77   Resp 17   Ht  (1.702 m)   Wt 65.8 kg (145 lb)   LMP 01/01/2017 Comment: on the patch  SpO2 98%   BMI 22.71 kg/m   Physical Exam  Cardiovascular: Normal rate.  Musculoskeletal:  2 linear abrasions to left medial lower leg.  Both horizontal and approximately 1.5 to 2 cm long.  Some mild surrounding erythema.  No fluctuance.  No drainage.  Skin: Capillary refill takes less than 2 seconds.  ED Treatments / Results  Labs (all labs ordered are listed, but only abnormal results are displayed) Labs Reviewed - No data to display  EKG None  Radiology No results found.  Procedures Procedures (including critical care time)  Medications Ordered in ED Medications  Tdap (BOOSTRIX) injection 0.5 mL (0.5 mLs Intramuscular Given 06/01/17 2207)     Initial Impression / Assessment and Plan / ED Course  I have reviewed the triage vital signs and the nursing notes.  Pertinent labs & imaging results that were available during my care of the patient were reviewed by me and considered in my medical decision making (see chart for details).     Patient with linear wounds on her lower leg.  Doubt snakebite but could be abrasions.  If it was a snake bite likely dry bite.  Will discharge home. Tetanus was updated.   Final Clinical Impressions(s) / ED Diagnoses   Final diagnoses:  Leg wound, left,  initial encounter    ED Discharge Orders    None       Benjiman Core, MD 06/02/17 [phone]

## 2017-07-06 DIAGNOSIS — Z23 Encounter for immunization: Secondary | ICD-10-CM | POA: Diagnosis not present

## 2017-07-19 DIAGNOSIS — J060 Acute laryngopharyngitis: Secondary | ICD-10-CM | POA: Diagnosis not present

## 2017-07-19 DIAGNOSIS — Z6824 Body mass index (BMI) 24.0-24.9, adult: Secondary | ICD-10-CM | POA: Diagnosis not present

## 2017-09-22 DIAGNOSIS — F909 Attention-deficit hyperactivity disorder, unspecified type: Secondary | ICD-10-CM | POA: Diagnosis not present

## 2017-10-14 DIAGNOSIS — Z6823 Body mass index (BMI) 23.0-23.9, adult: Secondary | ICD-10-CM | POA: Diagnosis not present

## 2017-10-14 DIAGNOSIS — Z3009 Encounter for other general counseling and advice on contraception: Secondary | ICD-10-CM | POA: Diagnosis not present

## 2017-10-14 DIAGNOSIS — F909 Attention-deficit hyperactivity disorder, unspecified type: Secondary | ICD-10-CM | POA: Diagnosis not present

## 2018-01-19 DIAGNOSIS — R110 Nausea: Secondary | ICD-10-CM | POA: Diagnosis not present

## 2018-01-19 DIAGNOSIS — F909 Attention-deficit hyperactivity disorder, unspecified type: Secondary | ICD-10-CM | POA: Diagnosis not present

## 2018-03-02 DIAGNOSIS — B078 Other viral warts: Secondary | ICD-10-CM | POA: Diagnosis not present

## 2018-03-02 DIAGNOSIS — D225 Melanocytic nevi of trunk: Secondary | ICD-10-CM | POA: Diagnosis not present

## 2018-03-02 DIAGNOSIS — Z1283 Encounter for screening for malignant neoplasm of skin: Secondary | ICD-10-CM | POA: Diagnosis not present

## 2018-03-23 DIAGNOSIS — D2272 Melanocytic nevi of left lower limb, including hip: Secondary | ICD-10-CM | POA: Diagnosis not present

## 2018-03-23 DIAGNOSIS — D2262 Melanocytic nevi of left upper limb, including shoulder: Secondary | ICD-10-CM | POA: Diagnosis not present

## 2018-05-03 ENCOUNTER — Telehealth: Payer: Federal, State, Local not specified - PPO | Admitting: Physician Assistant

## 2018-05-03 DIAGNOSIS — B889 Infestation, unspecified: Secondary | ICD-10-CM

## 2018-05-03 MED ORDER — IVERMECTIN 0.5 % EX LOTN: TOPICAL_LOTION | CUTANEOUS | 0 refills | Status: DC

## 2018-05-03 NOTE — Progress Notes (Signed)
E-Visit for Lice  We are sorry that you are not feeling well. Here is how we plan to help!  Based on what you have shared with me it looks like you have head lice.  Lice are very tiny insects that like to live on hair.  An infection with head lice is very common in school-age children, but anyone can get lice.  Head lice do not live on pets and cannot jump, fly or walk on the ground.  But they easily pass from person to person through close contact and on clothes, bed linens, brushes, combs, hats and toys.  Head lice infections are not dangerous but they can be difficult to treat.  Lice are contagious and should be treated right away to stop infection from spreading.   I recommend that you use: I have prescribed Ivermectin lotion (Sklice) 0.5%.  This may be used with or without nit combing.  Apply once to the entire area of scalp and hair.  Do not repeat without talking with a healthcare provider.   HOME CARE:  You should treat all members in your household. Wash towels, clothes, bed linens, cloth toys, hats and other personal items in hot water and dry on high heat. Wash all combs and brushes in very hot soapy water or throw them out and buy new ones. Vacuum floors and furniture and throw out the bag afterwards. Do not go to work or school until the morning after your treatment for lice. If family members are also infected, you may need to notify your child's day care or school so that other children can be checked.  GET HELP RIGHT AWAY IF:  Your treatment does not get rid of lice. You develop sores on your scalp that become infected, worsen or do not heal. You or your child become itchy or are scratching in areas other than the scalp.  MAKE SURE YOU:  Understand these instructions. Will watch your condition. Will get help right away if you are not doing well or get worse.  Your e-visit answers were reviewed by a board certified advanced clinical practitioner to complete your personal  care plan.  Depending upon the condition, your plan could have included both over the counter or prescription medications.    Please review your pharmacy choice.  Make sure the pharmacy is open so you can pick up prescription now.   If there is a problem, you may contact your provider through [employer] and have the prescription routed to another pharmacy. Your safety is important to [place].  If you have drug allergies check your prescription carefully.    For the next 24 hours you can use MyChart to ask questions about today's visit, request a non-urgent call back, or ask for a work or school excuse.  You will get an email in the next 2 days asking about your experience.  I hope that your e-visit has been valuable and will speed your recovery.  If you need an urgent face to face visit, [place] has four urgent care centers for your convenience.  [facility]  [phone] Get Driving Directions Find a Provider at this Location  [address] [place], [place] [address] 8 am to 8 pm Monday-Friday 9 am to 7 pm [facility] at [facility]  [phone] Get Driving Directions Find a Provider at this Location  [address] [place], [place] [address] 8 am to 8 pm Monday-Friday 9 am  to 6 pm Saturday 11 am to 6 pm Sunday   [facility] [place]  [phone] Get Driving Directions  [address] [place], [place] [id number] 8 am to 8 pm Monday-Friday 9 am to 4 pm Saturday-Sunday   [facility] (a walk in primary care provider)  [phone]  Get Driving Directions Find a Provider at this Location  [address] [place], [place] [phone] 8 am to 8:30 pm Monday-Thursday 8 am to 6 pm Friday 8 am to 4 pm Saturday-Sunday     ===View-only below this line===   ----- Message -----    From: Leslie Simon    Sent: 05/03/2018  8:26 AM EDT      To:  E-Visit Mailing List Subject: E-Visit Submission: Head Lice  E-Visit Submission: Head Lice --------------------------------  Question: Have you been exposed to lice? Answer:   Yes  Question: Was that exposure from a member of your immediate family? Answer:   No  Question: When did your symptoms first start? Answer:   Over 2 weeks ago  Question: Do you see small white, drop shaped nits (eggs) on the hair next to your scalp? Answer:   Yes  Question: Have you developed sores or ulcers on your scalp? Answer:   Yes  Question: Have you tried any over-the-counter shampoos or lotions? Answer:   Nix  Question: Over-the counter treatment comments Answer:   I ahve tried everything. Even st home remedies. Nothing!  Question: Did you repeat the treatment in the indicated interval? Answer:   Yes  Question: Did your symptoms continue after you completed the over-the-counter treatment? Answer:   Yes  Question: Please list your medication allergies that you may have ? (If 'none' , please list as 'none') Answer:   None  Question: Please list any additional comments  Answer:   Ive tried everything and im going crazy! I feel like they are crawling over me. Its like a hybrid of fleas and lice. I cant take the itching anymore! I really dont wanna shave my head !!  Question: Are you pregnant? Answer:   I am confident that I am not pregnant  Question: Are you breastfeeding? Answer:   No  A total of 5-10 minutes was spent evaluating this patients questionnaire and formulating a plan of care.

## 2018-05-05 ENCOUNTER — Telehealth: Payer: Federal, State, Local not specified - PPO | Admitting: Physician Assistant

## 2018-05-05 ENCOUNTER — Other Ambulatory Visit: Payer: Self-pay | Admitting: Physician Assistant

## 2018-05-05 DIAGNOSIS — B850 Pediculosis due to Pediculus humanus capitis: Secondary | ICD-10-CM

## 2018-05-05 MED ORDER — SPINOSAD 0.9 % EX SUSP: CUTANEOUS | 0 refills | Status: AC

## 2018-05-05 MED ORDER — BENZYL ALCOHOL 5 % EX LOTN: TOPICAL_LOTION | CUTANEOUS | 0 refills | Status: DC

## 2018-05-05 MED ORDER — IVERMECTIN 0.5 % EX LOTN: TOPICAL_LOTION | CUTANEOUS | 0 refills | Status: DC

## 2018-05-05 NOTE — Progress Notes (Signed)
E-Visit for Lice  We are sorry that you are not feeling well. Here is how we plan to help!  Based on what you have shared with me it looks like you have head lice.  Lice are very tiny insects that like to live on hair.  An infection with head lice is very common in school-age children, but anyone can get lice.  Head lice do not live on pets and cannot jump, fly or walk on the ground.  But they easily pass from person to person through close contact and on clothes, bed linens, brushes, combs, hats and toys.  Head lice infections are not dangerous but they can be difficult to treat.  Lice are contagious and should be treated right away to stop infection from spreading.   I recommend that you use: I have prescribed Benzyl alcohol lotion 5%. Apply appropriate volume for hair length to dry hair and completely saturate the scalp; leave on for 10 minutes; rinse thoroughly with water; repeat in 7 days.  Be sure to use the nit comb as directed.   HOME CARE:   You should treat all members in your household.  Wash towels, clothes, bed linens, cloth toys, hats and other personal items in hot water and dry on high heat.  Wash all combs and brushes in very hot soapy water or throw them out and buy new ones.  Vacuum floors and furniture and throw out the bag afterwards.  Do not go to work or school until the morning after your treatment for lice.  If family members are also infected, you may need to notify your child's day care or school so that other children can be checked.  GET HELP RIGHT AWAY IF:   Your treatment does not get rid of lice.  You develop sores on your scalp that become infected, worsen or do not heal.  You or your child become itchy or are scratching in areas other than the scalp.  MAKE SURE YOU:   Understand these instructions.  Will watch your condition.  Will get help right away if you are not doing well or get worse.  Your e-visit answers were reviewed by a board  certified advanced clinical practitioner to complete your personal care plan.  Depending upon the condition, your plan could have included both over the counter or prescription medications.    Please review your pharmacy choice.  Make sure the pharmacy is open so you can pick up prescription now.   If there is a problem, you may contact your provider through [employer] and have the prescription routed to another pharmacy. Your safety is important to [place].  If you have drug allergies check your prescription carefully.    For the next 24 hours you can use MyChart to ask questions about today's visit, request a non-urgent call back, or ask for a work or school excuse.  You will get an email in the next 2 days asking about your experience.  I hope that your e-visit has been valuable and will speed your recovery.  If you need an urgent face to face visit, [place] has four urgent care centers for your convenience.  [facility]  [phone] Get Driving Directions Find a Provider at this Location  [address] [place], [place] [address] . 8 am to 8 pm Monday-Friday . 9 am to 7 pm Saturday-Sunday  .  Urgent Care at [facility]  [phone] Get Driving Directions Find a  Provider at this Location  [address] [place], [place] [address] . 8 am to 8 pm Monday-Friday . 9 am to 6 pm Saturday . 11 am to 6 pm Sunday   . [facility] [place]  [phone] Get Driving Directions  [address] [place], [place] [address] . 8 am to 8 pm Monday-Friday . 9 am to 4 pm Saturday-Sunday   . [facility] (a walk in primary care provider)  [phone]  Get Driving Directions Find a Provider at this Location  [address] [place], [place] [address] . 8 am to 8:30 pm Monday-Thursday . 8 am to 6 pm Friday . 8 am to 4 pm Saturday-Sunday

## 2018-05-05 NOTE — Progress Notes (Signed)
Patient did e-visit 05/03/2018 and did not get medication. Med resent. No charge.

## 2018-05-11 DIAGNOSIS — K219 Gastro-esophageal reflux disease without esophagitis: Secondary | ICD-10-CM | POA: Diagnosis not present

## 2018-05-11 DIAGNOSIS — F902 Attention-deficit hyperactivity disorder, combined type: Secondary | ICD-10-CM | POA: Diagnosis not present

## 2018-06-21 ENCOUNTER — Telehealth: Payer: Self-pay | Admitting: Adult Health

## 2018-06-21 ENCOUNTER — Telehealth: Payer: Federal, State, Local not specified - PPO | Admitting: Physician Assistant

## 2018-06-21 DIAGNOSIS — N898 Other specified noninflammatory disorders of vagina: Secondary | ICD-10-CM

## 2018-06-21 NOTE — Telephone Encounter (Signed)
Patient thinks she has BV, fishy odor smell, she has a new sex partner.  Please advise  [employer]  [phone]

## 2018-06-21 NOTE — Progress Notes (Signed)
Based on what you shared with me, I feel your condition warrants further evaluation and I recommend that you be seen for a face to face office visit.     NOTE: If you entered your credit card information for this eVisit, you will not be charged. You may see a "hold" on your card for the $35 but that hold will drop off and you will not have a charge processed.  If you are having a true medical emergency please call 911.  If you need an urgent face to face visit, [place] has four urgent care centers for your convenience.    ?  [email or web address] to reserve your spot online an avoid wait times  [facility] [address], [address] [place], [place] [address] Modified hours of operation: Monday-Friday, 12 PM to 6 PM  Saturday & Sunday 10 AM to 4 PM *Across the street from [employer]  [facility] (New Address!) [address], [address] [place], [place] [address] *Just off [address], across the road from [employer]* Modified hours of operation: Monday-Friday, 12 PM to 6 PM  Closed Saturday & Sunday   The following sites will take your insurance:  [facility]  [phone] Get Driving Directions Find a Provider at this Location  [address] [place], [place] [address] 10 am to 8 pm Monday-Friday 12 pm to 8 pm [facility]   [facility]  [phone] Get Driving Directions Find a Provider at this Location  [address], [address] [place], [place] [address] 8 am to 8 pm Monday-Friday 9 am to 6 pm Saturday 11 am to 6 pm Sunday   [facility] [place]  [phone] Get Driving Directions  [address] [place], [place] [address] 8 am to 8 pm Monday-Friday 8 am to 4 pm Saturday-Sunday   Your e-visit answers were reviewed by a board certified advanced clinical practitioner to complete your personal care plan.  Thank you for  using e-Visits.  ===View-only below this line===   ----- Message -----    From: Leslie Simon    Sent: 06/21/2018 10:13 AM EDT      To: E-Visit Mailing List Subject: E-Visit Submission: Vaginal Symptoms  E-Visit Submission: Vaginal Symptoms --------------------------------  Question: Which of the following are you experiencing? Answer:   Vaginal discharge  Question: Are you having pain while passing urine? Answer:   No, I have no pain while urinating  Question: Which of the following applies to your vaginal discharge Answer:   I have a white/milky discharge            I have a foul-smelling discharge  Question: Are you pregnant? Answer:   I am confident that I am not pregnant  Question: Are you breastfeeding? Answer:   No  Question: Which of the following are you experiencing? Answer:   None of the above  Question: Do you have any sores on your genitals? Answer:   No  Question: Have you taken antibiotics recently? Answer:   I have not been on any antibiotics  Question: Do you do any of the following? Answer:   None of the above  Question: Which of the following applies to your menstrual period? Answer:   I had a menstrual period in the last 2 weeks  Question: Have you had similar symptoms in the past? Answer:   No, I have never had  these symptoms  Question: Please specify  what other treatments worked for you in the past Answer:   My friend is a Marine scientist and she told me i could have BV because i recently just got a new sex partner and she says its all the symptoms of bacterial vaginosis.  Question: Do you have a fever? Answer:   No, I do not have a fever  Question: During the past 2 months, have you had sexual contact with a specific person for the first time? Answer:   Yes  Question: Has a person with whom you have had sexual contact been recently told they have a disease possibly acquired through sex? Answer:   No  Question: Please list your medication  allergies that you may have ? (If 'none' , please list as 'none') Answer:   None  Question: Please list any additional comments  Answer:   I recently just got a new sex partner and he cane inside of me and i think gave me BV. Because ive never smelled this smell coming from my vaginal area. Its sooo strong and smells like fish. My friend is a Marine scientist and told me its BV. Shes had this before.  A total of 5-10 minutes was spent evaluating this patients questionnaire and formulating a plan of care.

## 2018-06-21 NOTE — Telephone Encounter (Signed)
Called patient and left a message to call us tomorrow and we'll schedule her

## 2018-07-12 DIAGNOSIS — F909 Attention-deficit hyperactivity disorder, unspecified type: Secondary | ICD-10-CM | POA: Diagnosis not present

## 2018-07-13 ENCOUNTER — Other Ambulatory Visit: Payer: Self-pay

## 2018-07-13 DIAGNOSIS — Z20822 Contact with and (suspected) exposure to covid-19: Secondary | ICD-10-CM

## 2018-07-17 LAB — NOVEL CORONAVIRUS, NAA: SARS-CoV-2, NAA: NOT DETECTED

## 2018-10-12 DIAGNOSIS — F902 Attention-deficit hyperactivity disorder, combined type: Secondary | ICD-10-CM | POA: Diagnosis not present

## 2018-10-12 DIAGNOSIS — Z0001 Encounter for general adult medical examination with abnormal findings: Secondary | ICD-10-CM | POA: Diagnosis not present

## 2018-10-12 DIAGNOSIS — K219 Gastro-esophageal reflux disease without esophagitis: Secondary | ICD-10-CM | POA: Diagnosis not present
